# Patient Record
Sex: Female | Born: 1935 | Race: White | Hispanic: No
Health system: Southern US, Community
[De-identification: ages and names within clinical notes are randomized; demographics above are authoritative.]

## PROBLEM LIST (undated history)

## (undated) DIAGNOSIS — N8110 Cystocele, unspecified: Secondary | ICD-10-CM

## (undated) DIAGNOSIS — B356 Tinea cruris: Secondary | ICD-10-CM

## (undated) DIAGNOSIS — G4700 Insomnia, unspecified: Secondary | ICD-10-CM

## (undated) DIAGNOSIS — R32 Unspecified urinary incontinence: Secondary | ICD-10-CM

## (undated) DIAGNOSIS — R7303 Prediabetes: Secondary | ICD-10-CM

## (undated) DIAGNOSIS — I10 Essential (primary) hypertension: Secondary | ICD-10-CM

## (undated) DIAGNOSIS — E539 Vitamin B deficiency, unspecified: Secondary | ICD-10-CM

## (undated) DIAGNOSIS — F329 Major depressive disorder, single episode, unspecified: Secondary | ICD-10-CM

## (undated) DIAGNOSIS — F39 Unspecified mood [affective] disorder: Secondary | ICD-10-CM

## (undated) DIAGNOSIS — J189 Pneumonia, unspecified organism: Secondary | ICD-10-CM

## (undated) DIAGNOSIS — R634 Abnormal weight loss: Secondary | ICD-10-CM

## (undated) DIAGNOSIS — N814 Uterovaginal prolapse, unspecified: Secondary | ICD-10-CM

## (undated) DIAGNOSIS — IMO0002 Reserved for concepts with insufficient information to code with codable children: Secondary | ICD-10-CM

## (undated) DIAGNOSIS — E041 Nontoxic single thyroid nodule: Secondary | ICD-10-CM

## (undated) DIAGNOSIS — F32A Depression, unspecified: Secondary | ICD-10-CM

## (undated) DIAGNOSIS — F0390 Unspecified dementia without behavioral disturbance: Secondary | ICD-10-CM

## (undated) DIAGNOSIS — D649 Anemia, unspecified: Secondary | ICD-10-CM

## (undated) HISTORY — DX: Pneumonia, unspecified organism: J18.9

## (undated) HISTORY — DX: Nontoxic single thyroid nodule: E04.1

## (undated) HISTORY — DX: Anemia, unspecified: D64.9

## (undated) HISTORY — DX: Essential (primary) hypertension: I10

## (undated) HISTORY — DX: Cystocele, unspecified: N81.10

## (undated) HISTORY — DX: Insomnia, unspecified: G47.00

## (undated) HISTORY — DX: Tinea cruris: B35.6

## (undated) HISTORY — DX: Reserved for concepts with insufficient information to code with codable children: IMO0002

## (undated) HISTORY — DX: Vitamin B deficiency, unspecified: E53.9

## (undated) HISTORY — DX: Unspecified dementia, unspecified severity, without behavioral disturbance, psychotic disturbance, mood disturbance, and anxiety: F03.90

## (undated) HISTORY — DX: Uterovaginal prolapse, unspecified: N81.4

## (undated) HISTORY — DX: Unspecified mood (affective) disorder: F39

## (undated) HISTORY — DX: Prediabetes: R73.03

## (undated) HISTORY — DX: Abnormal weight loss: R63.4

## (undated) HISTORY — DX: Depression, unspecified: F32.A

## (undated) HISTORY — DX: Unspecified urinary incontinence: R32

## (undated) HISTORY — DX: Major depressive disorder, single episode, unspecified: F32.9

---

## 1997-07-07 ENCOUNTER — Encounter: Admission: RE | Admit: 1997-07-07 | Discharge: 1997-07-07 | Payer: Self-pay | Admitting: Sports Medicine

## 1998-04-05 ENCOUNTER — Encounter: Payer: Self-pay | Admitting: General Surgery

## 1998-04-05 ENCOUNTER — Ambulatory Visit (HOSPITAL_COMMUNITY): Admission: RE | Admit: 1998-04-05 | Discharge: 1998-04-06 | Payer: Self-pay | Admitting: General Surgery

## 2000-12-18 ENCOUNTER — Emergency Department (HOSPITAL_COMMUNITY): Admission: EM | Admit: 2000-12-18 | Discharge: 2000-12-18 | Payer: Self-pay | Admitting: *Deleted

## 2002-02-09 ENCOUNTER — Emergency Department (HOSPITAL_COMMUNITY): Admission: EM | Admit: 2002-02-09 | Discharge: 2002-02-09 | Payer: Self-pay | Admitting: Emergency Medicine

## 2002-02-09 ENCOUNTER — Encounter: Payer: Self-pay | Admitting: Emergency Medicine

## 2002-02-27 ENCOUNTER — Emergency Department (HOSPITAL_COMMUNITY): Admission: EM | Admit: 2002-02-27 | Discharge: 2002-02-27 | Payer: Self-pay

## 2002-04-23 ENCOUNTER — Ambulatory Visit (HOSPITAL_COMMUNITY): Admission: RE | Admit: 2002-04-23 | Discharge: 2002-04-23 | Payer: Self-pay | Admitting: Neurology

## 2002-05-19 ENCOUNTER — Ambulatory Visit (HOSPITAL_COMMUNITY): Admission: RE | Admit: 2002-05-19 | Discharge: 2002-05-19 | Payer: Self-pay | Admitting: Neurology

## 2002-05-19 ENCOUNTER — Encounter: Payer: Self-pay | Admitting: Neurology

## 2002-11-21 ENCOUNTER — Encounter: Payer: Self-pay | Admitting: Internal Medicine

## 2002-11-21 ENCOUNTER — Inpatient Hospital Stay (HOSPITAL_COMMUNITY): Admission: EM | Admit: 2002-11-21 | Discharge: 2002-11-25 | Payer: Self-pay | Admitting: Emergency Medicine

## 2002-11-23 ENCOUNTER — Encounter: Payer: Self-pay | Admitting: Internal Medicine

## 2003-02-09 ENCOUNTER — Emergency Department (HOSPITAL_COMMUNITY): Admission: EM | Admit: 2003-02-09 | Discharge: 2003-02-09 | Payer: Self-pay | Admitting: Emergency Medicine

## 2003-06-19 ENCOUNTER — Emergency Department (HOSPITAL_COMMUNITY): Admission: EM | Admit: 2003-06-19 | Discharge: 2003-06-19 | Payer: Self-pay | Admitting: Emergency Medicine

## 2003-06-26 ENCOUNTER — Emergency Department (HOSPITAL_COMMUNITY): Admission: EM | Admit: 2003-06-26 | Discharge: 2003-06-26 | Payer: Self-pay | Admitting: *Deleted

## 2003-07-01 ENCOUNTER — Emergency Department (HOSPITAL_COMMUNITY): Admission: EM | Admit: 2003-07-01 | Discharge: 2003-07-02 | Payer: Self-pay | Admitting: Emergency Medicine

## 2003-08-10 ENCOUNTER — Encounter: Admission: RE | Admit: 2003-08-10 | Discharge: 2003-08-10 | Payer: Self-pay | Admitting: Family Medicine

## 2003-09-15 ENCOUNTER — Emergency Department (HOSPITAL_COMMUNITY): Admission: EM | Admit: 2003-09-15 | Discharge: 2003-09-15 | Payer: Self-pay | Admitting: Emergency Medicine

## 2003-12-23 ENCOUNTER — Other Ambulatory Visit: Admission: RE | Admit: 2003-12-23 | Discharge: 2003-12-23 | Payer: Self-pay | Admitting: Obstetrics and Gynecology

## 2004-01-07 ENCOUNTER — Emergency Department (HOSPITAL_COMMUNITY): Admission: EM | Admit: 2004-01-07 | Discharge: 2004-01-07 | Payer: Self-pay | Admitting: Emergency Medicine

## 2005-07-19 ENCOUNTER — Emergency Department (HOSPITAL_COMMUNITY): Admission: EM | Admit: 2005-07-19 | Discharge: 2005-07-19 | Payer: Self-pay | Admitting: Emergency Medicine

## 2005-07-28 ENCOUNTER — Emergency Department (HOSPITAL_COMMUNITY): Admission: EM | Admit: 2005-07-28 | Discharge: 2005-07-28 | Payer: Self-pay | Admitting: Emergency Medicine

## 2005-08-01 ENCOUNTER — Emergency Department (HOSPITAL_COMMUNITY): Admission: EM | Admit: 2005-08-01 | Discharge: 2005-08-01 | Payer: Self-pay | Admitting: Emergency Medicine

## 2005-11-15 ENCOUNTER — Encounter: Admission: RE | Admit: 2005-11-15 | Discharge: 2005-11-15 | Payer: Self-pay | Admitting: Internal Medicine

## 2005-12-11 ENCOUNTER — Encounter: Admission: RE | Admit: 2005-12-11 | Discharge: 2005-12-11 | Payer: Self-pay | Admitting: Internal Medicine

## 2008-08-30 ENCOUNTER — Emergency Department (HOSPITAL_BASED_OUTPATIENT_CLINIC_OR_DEPARTMENT_OTHER): Admission: EM | Admit: 2008-08-30 | Discharge: 2008-08-30 | Payer: Self-pay | Admitting: Emergency Medicine

## 2008-12-01 ENCOUNTER — Emergency Department (HOSPITAL_BASED_OUTPATIENT_CLINIC_OR_DEPARTMENT_OTHER): Admission: EM | Admit: 2008-12-01 | Discharge: 2008-12-01 | Payer: Self-pay | Admitting: Emergency Medicine

## 2008-12-01 ENCOUNTER — Ambulatory Visit: Payer: Self-pay | Admitting: Diagnostic Radiology

## 2009-05-10 ENCOUNTER — Emergency Department (HOSPITAL_BASED_OUTPATIENT_CLINIC_OR_DEPARTMENT_OTHER): Admission: EM | Admit: 2009-05-10 | Discharge: 2009-05-10 | Payer: Self-pay | Admitting: Emergency Medicine

## 2010-02-20 ENCOUNTER — Emergency Department (HOSPITAL_BASED_OUTPATIENT_CLINIC_OR_DEPARTMENT_OTHER): Admission: EM | Admit: 2010-02-20 | Discharge: 2010-02-20 | Payer: Self-pay | Admitting: Emergency Medicine

## 2010-02-20 ENCOUNTER — Ambulatory Visit: Payer: Self-pay | Admitting: Interventional Radiology

## 2010-04-15 ENCOUNTER — Emergency Department (HOSPITAL_BASED_OUTPATIENT_CLINIC_OR_DEPARTMENT_OTHER)
Admission: EM | Admit: 2010-04-15 | Discharge: 2010-04-15 | Payer: Self-pay | Source: Home / Self Care | Admitting: Emergency Medicine

## 2010-04-24 ENCOUNTER — Encounter: Payer: Self-pay | Admitting: Family Medicine

## 2010-07-24 ENCOUNTER — Emergency Department (HOSPITAL_BASED_OUTPATIENT_CLINIC_OR_DEPARTMENT_OTHER)
Admission: EM | Admit: 2010-07-24 | Discharge: 2010-07-24 | Disposition: A | Discharge status: Home / Self Care | Payer: Medicare Other | Attending: Emergency Medicine | Admitting: Emergency Medicine

## 2010-07-24 DIAGNOSIS — I10 Essential (primary) hypertension: Secondary | ICD-10-CM | POA: Insufficient documentation

## 2010-07-24 DIAGNOSIS — F0280 Dementia in other diseases classified elsewhere without behavioral disturbance: Secondary | ICD-10-CM | POA: Insufficient documentation

## 2010-07-24 DIAGNOSIS — W07XXXA Fall from chair, initial encounter: Secondary | ICD-10-CM | POA: Insufficient documentation

## 2010-07-24 DIAGNOSIS — G309 Alzheimer's disease, unspecified: Secondary | ICD-10-CM | POA: Insufficient documentation

## 2010-07-24 DIAGNOSIS — Z0389 Encounter for observation for other suspected diseases and conditions ruled out: Secondary | ICD-10-CM | POA: Insufficient documentation

## 2010-07-24 DIAGNOSIS — Z79899 Other long term (current) drug therapy: Secondary | ICD-10-CM | POA: Insufficient documentation

## 2010-07-24 DIAGNOSIS — Y9210 Unspecified residential institution as the place of occurrence of the external cause: Secondary | ICD-10-CM | POA: Insufficient documentation

## 2010-08-01 ENCOUNTER — Emergency Department (HOSPITAL_BASED_OUTPATIENT_CLINIC_OR_DEPARTMENT_OTHER)
Admission: EM | Admit: 2010-08-01 | Discharge: 2010-08-01 | Disposition: A | Discharge status: Home / Self Care | Payer: Medicare Other | Attending: Emergency Medicine | Admitting: Emergency Medicine

## 2010-08-01 ENCOUNTER — Emergency Department (INDEPENDENT_AMBULATORY_CARE_PROVIDER_SITE_OTHER): Payer: Medicare Other

## 2010-08-01 DIAGNOSIS — S1093XA Contusion of unspecified part of neck, initial encounter: Secondary | ICD-10-CM

## 2010-08-01 DIAGNOSIS — G319 Degenerative disease of nervous system, unspecified: Secondary | ICD-10-CM | POA: Insufficient documentation

## 2010-08-01 DIAGNOSIS — F0390 Unspecified dementia without behavioral disturbance: Secondary | ICD-10-CM | POA: Insufficient documentation

## 2010-08-01 DIAGNOSIS — Y92009 Unspecified place in unspecified non-institutional (private) residence as the place of occurrence of the external cause: Secondary | ICD-10-CM | POA: Insufficient documentation

## 2010-08-01 DIAGNOSIS — W1809XA Striking against other object with subsequent fall, initial encounter: Secondary | ICD-10-CM | POA: Insufficient documentation

## 2010-08-01 DIAGNOSIS — Z79899 Other long term (current) drug therapy: Secondary | ICD-10-CM | POA: Insufficient documentation

## 2010-08-01 DIAGNOSIS — W19XXXA Unspecified fall, initial encounter: Secondary | ICD-10-CM

## 2010-08-01 DIAGNOSIS — I10 Essential (primary) hypertension: Secondary | ICD-10-CM | POA: Insufficient documentation

## 2010-08-01 DIAGNOSIS — S0990XA Unspecified injury of head, initial encounter: Secondary | ICD-10-CM | POA: Insufficient documentation

## 2010-08-19 ENCOUNTER — Emergency Department (INDEPENDENT_AMBULATORY_CARE_PROVIDER_SITE_OTHER): Payer: Medicare Other

## 2010-08-19 ENCOUNTER — Emergency Department (HOSPITAL_BASED_OUTPATIENT_CLINIC_OR_DEPARTMENT_OTHER)
Admission: EM | Admit: 2010-08-19 | Discharge: 2010-08-19 | Disposition: A | Discharge status: Home / Self Care | Payer: Medicare Other | Attending: Emergency Medicine | Admitting: Emergency Medicine

## 2010-08-19 DIAGNOSIS — S8000XA Contusion of unspecified knee, initial encounter: Secondary | ICD-10-CM

## 2010-08-19 DIAGNOSIS — S0990XA Unspecified injury of head, initial encounter: Secondary | ICD-10-CM | POA: Insufficient documentation

## 2010-08-19 DIAGNOSIS — M47812 Spondylosis without myelopathy or radiculopathy, cervical region: Secondary | ICD-10-CM

## 2010-08-19 DIAGNOSIS — M4802 Spinal stenosis, cervical region: Secondary | ICD-10-CM | POA: Insufficient documentation

## 2010-08-19 DIAGNOSIS — I10 Essential (primary) hypertension: Secondary | ICD-10-CM | POA: Insufficient documentation

## 2010-08-19 DIAGNOSIS — G319 Degenerative disease of nervous system, unspecified: Secondary | ICD-10-CM | POA: Insufficient documentation

## 2010-08-19 DIAGNOSIS — W19XXXA Unspecified fall, initial encounter: Secondary | ICD-10-CM

## 2010-08-19 DIAGNOSIS — S1093XA Contusion of unspecified part of neck, initial encounter: Secondary | ICD-10-CM

## 2010-08-19 DIAGNOSIS — M25569 Pain in unspecified knee: Secondary | ICD-10-CM | POA: Insufficient documentation

## 2010-08-19 DIAGNOSIS — F0390 Unspecified dementia without behavioral disturbance: Secondary | ICD-10-CM

## 2010-08-19 DIAGNOSIS — Z79899 Other long term (current) drug therapy: Secondary | ICD-10-CM | POA: Insufficient documentation

## 2010-08-19 DIAGNOSIS — Y9210 Unspecified residential institution as the place of occurrence of the external cause: Secondary | ICD-10-CM | POA: Insufficient documentation

## 2010-08-19 LAB — BASIC METABOLIC PANEL WITH GFR
BUN: 20 mg/dL (ref 6–23)
Chloride: 104 meq/L (ref 96–112)
GFR calc non Af Amer: 47 mL/min — ABNORMAL LOW (ref >60)
Potassium: 4.4 meq/L (ref 3.5–5.1)
Sodium: 140 meq/L (ref 135–145)

## 2010-08-19 LAB — CBC
HCT: 30.9 % — ABNORMAL LOW (ref 36.0–46.0)
Hemoglobin: 10.3 g/dL — ABNORMAL LOW (ref 12.0–15.0)
MCH: 31.6 pg (ref 26.0–34.0)
MCHC: 33.3 g/dL (ref 30.0–36.0)
MCV: 94.8 fL (ref 78.0–100.0)
Platelets: 170 K/uL (ref 150–400)
RBC: 3.26 MIL/uL — ABNORMAL LOW (ref 3.87–5.11)
RDW: 13.9 % (ref 11.5–15.5)
WBC: 8.3 K/uL (ref 4.0–10.5)

## 2010-08-19 LAB — BASIC METABOLIC PANEL
CO2: 22 mEq/L (ref 19–32)
Calcium: 9.8 mg/dL (ref 8.4–10.5)
Creatinine, Ser: 1.1 mg/dL (ref 0.4–1.2)
GFR calc Af Amer: 57 mL/min — ABNORMAL LOW (ref >60)
Glucose, Bld: 95 mg/dL (ref 70–99)

## 2010-08-19 NOTE — Discharge Summary (Signed)
NAME:  Margaret Mcgrath, Margaret Mcgrath                        ACCOUNT NO.:  [id number]   MEDICAL RECORD NO.:  [id number]                   PATIENT TYPE:  INP   LOCATION:  0371                                 FACILITY:  [facility]   PHYSICIAN:  Corinna L. Lendell Caprice, MD             DATE OF BIRTH:  02-16-1924   DATE OF ADMISSION:  11/21/2002  DATE OF DISCHARGE:  11/25/2002                                 DISCHARGE SUMMARY   DISCHARGE DIAGNOSES:  1. Anorexia, improved.  2. Abnormal CT of the pancreas.  Needs repeat CAT scan in three months.  3. Periumbilical abdominal pain.  4. Major depression, recurrent with psychotic features.  5. Tremor, psychiatric related.  6. Dementia.  7. History of alcohol abuse.   DISCHARGE MEDICATIONS:  1. She may continue her Klonopin 1 mg p.o. t.i.d.  2. She also may continue her Remeron 15 mg p.o. q.h.s.  3. Aricept 10 mg p.o. daily.  4. Paxil has been started 10 mg p.o. daily.  5. Seroquel has been started 25 mg p.o. b.i.d.   CONDITION ON DISCHARGE:  Stable.   FOLLOWUP:  Follow up with Danielle L. Mahaffey, M.D. on September 23 at 11  a.m.  Follow up with [facility] the outpatient side with Nawar  M. Alnaquib, M.D. on September 15 at 10 a.m.  Follow-up tests:  The patient  will need a follow-up CT of the pancreas in three months.   DIET:  No restrictions.   ACTIVITY:  Ad lib.   LABORATORIES:  The patient had an arsenic level which was normal.  Lead  level normal.  Mercury level normal.  Her BMET was normal.  CBC was  significant for hemoglobin of 11.4, hematocrit of 33.7, MCV 85, platelet  count 138,000, white count normal.  Homocysteine level was 42.  B12 was 241.  TSH was 0.298 which should be repeated as an outpatient as well as free T4.  UA was contaminated with many epithelial cells.  Methylmalonic acid is  pending.  Special studies in radiology:  CT of the abdomen and pelvis  without IV contrast showed mild scarring at both lung bases.  There  is a  soft tissue mass measuring 2.2 x 1.6 cm from the junction of the pancreatic  body and tail.  Ultrasound of the abdomen was done, but unable to see the  pancreas.  Upper GI and barium swallow essentially normal.  EKG shows normal  sinus rhythm.   CONSULTATIONS:  Antonietta Breach, M.D.   HISTORY AND HOSPITAL COURSE:  Ms. Margaret Mcgrath is a 77 year old demented white  female with a history of depression and nerves who presented with several  vague complaints.  She had apparently not been eating and kept complaining  of a hair on her tongue despite no evidence of such.  She also had had a  work-up of an unusual tremor which appeared completely voluntary and  involved her torso.  This  had been worked up in the past and was felt to be  supratentorial.  The patient had calorie counts and was given  supplementation.  She was started on Seroquel and Paxil and her appetite  improved as did her sensation of the hair being on her tongue.  She had no  tenderness on examination.  Has had no evidence of recent weight loss.  No  history of GI bleeding.  Sherin Quarry, MD discussed the case with three  radiologists and a surgeon and the final opinion was that this abnormal  pancreatic area on CAT scan was probably benign and as patient was not a  very good surgical candidate due to her dementia, advanced age, etc., the  decision was made to follow up with a CAT scan in three months.  The patient  had no other issues during her hospitalization and at the time of discharge  was tolerating 50-100% of her diet.  Unfortunately, on the day of discharge  I called her daughter, April Kiehn with whom patient lives and the other  daughter, Kendal Hymen answered.  Apparently, April has reportedly committed  suicide and I have discussed the case with the care manager and the  chaplain.  The patient will live with Kendal Hymen who wishes to tell her mother  about the suicide once she arrives at home.  I have encouraged Kendal Hymen  to  call 911 or bring patient to the emergency room if she has any suicidal  ideation herself.  Certainly, Ms. Feeser is encouraged to follow up with  [facility] and an appointment has been set up on September 15  at 10 a.m.   Total time on the day of discharge was 45 minutes.                                               Corinna L. Lendell Caprice, MD    CLS/MEDQ  D:  11/25/2002  T:  11/25/2002  Job:  [id number]   cc:   [facility]   Danielle L. Mahaffey, M.D.  [address]  [place]  [place] [address]  Fax: [phone]

## 2010-09-29 ENCOUNTER — Emergency Department (HOSPITAL_BASED_OUTPATIENT_CLINIC_OR_DEPARTMENT_OTHER)
Admission: EM | Admit: 2010-09-29 | Discharge: 2010-09-29 | Disposition: A | Discharge status: Home / Self Care | Payer: Medicare Other | Attending: Emergency Medicine | Admitting: Emergency Medicine

## 2010-09-29 ENCOUNTER — Emergency Department (INDEPENDENT_AMBULATORY_CARE_PROVIDER_SITE_OTHER): Payer: Medicare Other

## 2010-09-29 DIAGNOSIS — G319 Degenerative disease of nervous system, unspecified: Secondary | ICD-10-CM | POA: Insufficient documentation

## 2010-09-29 DIAGNOSIS — S0003XA Contusion of scalp, initial encounter: Secondary | ICD-10-CM | POA: Insufficient documentation

## 2010-09-29 DIAGNOSIS — W19XXXA Unspecified fall, initial encounter: Secondary | ICD-10-CM

## 2010-09-29 DIAGNOSIS — F3289 Other specified depressive episodes: Secondary | ICD-10-CM | POA: Insufficient documentation

## 2010-09-29 DIAGNOSIS — Y9210 Unspecified residential institution as the place of occurrence of the external cause: Secondary | ICD-10-CM | POA: Insufficient documentation

## 2010-09-29 DIAGNOSIS — M542 Cervicalgia: Secondary | ICD-10-CM

## 2010-09-29 DIAGNOSIS — F0390 Unspecified dementia without behavioral disturbance: Secondary | ICD-10-CM | POA: Insufficient documentation

## 2010-09-29 DIAGNOSIS — I10 Essential (primary) hypertension: Secondary | ICD-10-CM | POA: Insufficient documentation

## 2010-09-29 DIAGNOSIS — R51 Headache: Secondary | ICD-10-CM

## 2010-09-29 DIAGNOSIS — E0789 Other specified disorders of thyroid: Secondary | ICD-10-CM

## 2010-09-29 DIAGNOSIS — Z79899 Other long term (current) drug therapy: Secondary | ICD-10-CM | POA: Insufficient documentation

## 2010-09-29 DIAGNOSIS — F329 Major depressive disorder, single episode, unspecified: Secondary | ICD-10-CM | POA: Insufficient documentation

## 2010-09-29 DIAGNOSIS — S1093XA Contusion of unspecified part of neck, initial encounter: Secondary | ICD-10-CM

## 2010-12-02 ENCOUNTER — Encounter (INDEPENDENT_AMBULATORY_CARE_PROVIDER_SITE_OTHER): Payer: Self-pay | Admitting: Surgery

## 2010-12-07 ENCOUNTER — Ambulatory Visit (INDEPENDENT_AMBULATORY_CARE_PROVIDER_SITE_OTHER): Payer: Medicare Other | Admitting: Surgery

## 2010-12-07 ENCOUNTER — Encounter (INDEPENDENT_AMBULATORY_CARE_PROVIDER_SITE_OTHER): Payer: Self-pay | Admitting: Surgery

## 2010-12-07 VITALS — BP 148/82 | HR 78 | Temp 97.1°F | Wt 149.6 lb

## 2010-12-07 DIAGNOSIS — E042 Nontoxic multinodular goiter: Secondary | ICD-10-CM | POA: Insufficient documentation

## 2010-12-07 NOTE — Progress Notes (Signed)
Chief Complaint  Patient presents with  . Thyroid Nodule    Thyroid nodule - Dr. Laurine Blazer [phone]    HISTORY: Patient is an 77 year old female referred by her primary physician for evaluation of newly diagnosed thyroid nodule. Patient lives in a nursing facility due to Alzheimer's type dementia. She apparently sustained a fall and underwent diagnostic x-rays which showed an incidental finding of thyroid nodules. In July 2012 the patient underwent thyroid ultrasound by him over to radiology company from [place]. This ultrasound showed a mildly enlarged thyroid gland containing 2 nodules in the right thyroid lobe measuring 1.2 cm and 1.7 cm respectively. At the request of the patient's daughter, she is now referred to surgery for further evaluation and recommendations for management.  Ability to obtain history is limited as the patient has limited her communication skills and the health care worker who accompanies her does not know the history in detail. The patient's daughter is not present at today's consultation.  Patient has apparently not had any prior thyroid problems. She has never been on thyroid medication. She has had no prior head or neck surgery. There is no known family history of thyroid disease or other endocrinopathy.  Past Medical History  Diagnosis Date  . Dementia     with psychosis  . Depression   . Insomnia   . Anemia   . Mood disorder   . Uterine prolapse   . Vaginal prolapse   . Cystocele   . Incontinence   . HTN (hypertension)   . Dysphagia     pureed diet  . Prediabetes   . Tinea cruris   . Weight loss     03/2011  . Vitamin B deficiency   . Right thyroid nodule   . Pneumonia     10/2010     Current Outpatient Prescriptions  Medication Sig Dispense Refill  . acetaminophen (TYLENOL) 650 MG CR tablet Take 650 mg by mouth every 8 (eight) hours as needed.        Marland Kitchen albuterol (ACCUNEB) 1.25 MG/3ML nebulizer solution Take 1 ampule by nebulization every  6 (six) hours as needed. Patient takes 3 ml as needed bid       . azithromycin (ZITHROMAX) 250 MG tablet Take 250 mg by mouth daily.        . bisoprolol (ZEBETA) 5 MG tablet Take 5 mg by mouth daily.        . cholecalciferol (VITAMIN D) 1000 UNITS tablet Take 1,000 Units by mouth daily.        . cholecalciferol (VITAMIN D) 400 UNITS TABS Take by mouth.        . cyanocobalamin 1000 MCG tablet Take 100 mcg by mouth daily.        Marland Kitchen dextromethorphan-guaiFENesin (ROBITUSSIN-DM) 10-100 MG/5ML liquid Take 5 mLs by mouth every 4 (four) hours as needed.        . divalproex (DEPAKOTE SPRINKLE) 125 MG capsule Take 125 mg by mouth 2 (two) times daily.        Marland Kitchen donepezil (ARICEPT) 10 MG tablet Take 10 mg by mouth at bedtime as needed.        . ENSURE (ENSURE) Take 237 mLs by mouth.        . loperamide (IMODIUM A-D) 2 MG tablet Take 2 mg by mouth 4 (four) times daily as needed.        Marland Kitchen LORazepam (ATIVAN) 0.5 MG tablet Take 0.5 mg by mouth every 8 (eight) hours.        Marland Kitchen  megestrol (MEGACE ES) 625 MG/5ML suspension Take 625 mg by mouth daily.        . memantine (NAMENDA) 10 MG tablet Take 10 mg by mouth 2 (two) times daily.        . Multiple Vitamins-Iron (MULTIVITAMINS WITH IRON) TABS Take 1 tablet by mouth daily.        . Multiple Vitamins-Minerals (MULTIVITAMIN WITH MINERALS) tablet Take 1 tablet by mouth daily.        Marland Kitchen nystatin (MYCOSTATIN) powder Apply topically 2 (two) times daily.        Marland Kitchen olopatadine (PATANOL) 0.1 % ophthalmic solution 1 drop 2 (two) times daily.        Marland Kitchen omeprazole (PRILOSEC) 20 MG capsule Take 20 mg by mouth daily.        . permethrin (ELIMITE) 5 % cream Apply topically once.        . risperiDONE (RISPERDAL) 1 MG tablet Take 1 mg by mouth 2 (two) times daily.        Marland Kitchen senna (SENOKOT) 8.6 MG TABS Take 1 tablet by mouth.        . talc powder Apply topically as needed.        . traZODone (DESYREL) 50 MG tablet Take 50 mg by mouth at bedtime.        . vitamin C (ASCORBIC ACID) 500 MG  tablet Take 500 mg by mouth daily.           Allergies  Allergen Reactions  . Chlorpromazine   . Thorazine (Chlorpromazine Hcl)      No family history on file.   History   Social History  . Marital Status: Widowed    Spouse Name: N/A    Number of Children: N/A  . Years of Education: N/A   Social History Main Topics  . Smoking status: Never Smoker   . Smokeless tobacco: None  . Alcohol Use: No  . Drug Use: No  . Sexually Active: None   Other Topics Concern  . None   Social History Narrative  . None     REVIEW OF SYSTEMS - PERTINENT POSITIVES ONLY: The patient denies any compressive symptoms. She denies any signs or symptoms of hypo-or hyperthyroidism.   EXAM: Filed Vitals:   12/07/10 0952  BP: 148/82  Pulse: 78  Temp: 97.1 F (36.2 C)    HEENT: normocephalic; pupils equal and reactive; sclerae clear; dentition good; mucous membranes moist NECK:  Right thyroid lobe was mildly enlarged. There are palpable relatively firm nodules in the mid and inferior portion of the lobe S. spell with swallowing.; symmetric on extension; no palpable anterior or posterior cervical lymphadenopathy; no supraclavicular masses; no tenderness CHEST: clear to auscultation bilaterally without rales, rhonchi, or wheezes CARDIAC: regular rate and rhythm; Moderate grade 2 systolic murmur at upper left sternal border; peripheral pulses are full EXT:  non-tender without edema; no deformity NEURO: no gross focal deficits; moderate tremor in hands   LABORATORY RESULTS: See E-Chart for most recent results   RADIOLOGY RESULTS: See E-Chart or I-Site for most recent results   IMPRESSION: #1 small thyroid goiter without compressive symptoms #2 multiple right thyroid nodules, dominant nodule 1.7 cm #3 Alzheimer's type dementia   PLAN: I discussed with the patient and her health care worker the above findings. If further evaluation is desired by the patient's family, then I would  recommend an ultrasound-guided fine-needle aspiration biopsy of the dominant nodule. We will also check thyroid function tests to include TSH level,  total T3 level, and total T4 level.  Once these results are available I will communicate them with the patient's family and with her primary care physician.   Velora Heckler, MD, FACS General & Endocrine Surgery [facility]      Visit Diagnoses: 1. Multinodular goiter (nontoxic)     Primary Care Physician: Thane Edu, MD

## 2010-12-28 ENCOUNTER — Emergency Department (INDEPENDENT_AMBULATORY_CARE_PROVIDER_SITE_OTHER): Payer: Medicare Other

## 2010-12-28 ENCOUNTER — Other Ambulatory Visit: Payer: Medicare Other

## 2010-12-28 ENCOUNTER — Encounter (HOSPITAL_BASED_OUTPATIENT_CLINIC_OR_DEPARTMENT_OTHER): Payer: Self-pay | Admitting: Emergency Medicine

## 2010-12-28 ENCOUNTER — Emergency Department (HOSPITAL_BASED_OUTPATIENT_CLINIC_OR_DEPARTMENT_OTHER)
Admission: EM | Admit: 2010-12-28 | Discharge: 2010-12-28 | Disposition: A | Discharge status: Skilled Nursing Facility | Payer: Medicare Other | Attending: Emergency Medicine | Admitting: Emergency Medicine

## 2010-12-28 ENCOUNTER — Ambulatory Visit
Admission: RE | Admit: 2010-12-28 | Discharge: 2010-12-28 | Disposition: A | Discharge status: Home / Self Care | Payer: Medicare Other | Source: Ambulatory Visit | Attending: Surgery | Admitting: Surgery

## 2010-12-28 DIAGNOSIS — R51 Headache: Secondary | ICD-10-CM | POA: Insufficient documentation

## 2010-12-28 DIAGNOSIS — W1809XA Striking against other object with subsequent fall, initial encounter: Secondary | ICD-10-CM | POA: Insufficient documentation

## 2010-12-28 DIAGNOSIS — W19XXXA Unspecified fall, initial encounter: Secondary | ICD-10-CM

## 2010-12-28 DIAGNOSIS — N200 Calculus of kidney: Secondary | ICD-10-CM

## 2010-12-28 DIAGNOSIS — K5730 Diverticulosis of large intestine without perforation or abscess without bleeding: Secondary | ICD-10-CM

## 2010-12-28 DIAGNOSIS — F0390 Unspecified dementia without behavioral disturbance: Secondary | ICD-10-CM | POA: Insufficient documentation

## 2010-12-28 DIAGNOSIS — Y9210 Unspecified residential institution as the place of occurrence of the external cause: Secondary | ICD-10-CM | POA: Insufficient documentation

## 2010-12-28 DIAGNOSIS — Z79899 Other long term (current) drug therapy: Secondary | ICD-10-CM | POA: Insufficient documentation

## 2010-12-28 DIAGNOSIS — N201 Calculus of ureter: Secondary | ICD-10-CM

## 2010-12-28 DIAGNOSIS — N1330 Unspecified hydronephrosis: Secondary | ICD-10-CM

## 2010-12-28 DIAGNOSIS — I10 Essential (primary) hypertension: Secondary | ICD-10-CM | POA: Insufficient documentation

## 2010-12-28 NOTE — ED Provider Notes (Signed)
History    the patient presents from her nursing home after a fall. She is currently without complaints. She has dementia, but states that she has been otherwise well. Per reports she was being assisted by staff and fell backwards striking her occiput against a handrail. No loss of consciousness, no emesis, no visual changes, no sustained pain.  CSN: [id number] Arrival date & time: 12/28/2010  1:01 AM  Chief Complaint  Patient presents with  . Fall    HPI  (Consider location/radiation/quality/duration/timing/severity/associated sxs/prior treatment)  HPI  Past Medical History  Diagnosis Date  . Dementia     with psychosis  . Depression   . Insomnia   . Anemia   . Mood disorder   . Uterine prolapse   . Vaginal prolapse   . Cystocele   . Incontinence   . HTN (hypertension)   . Dysphagia     pureed diet  . Prediabetes   . Tinea cruris   . Weight loss     03/2011  . Vitamin B deficiency   . Right thyroid nodule   . Pneumonia     10/2010    History reviewed. No pertinent past surgical history.  No family history on file.  History  Substance Use Topics  . Smoking status: Never Smoker   . Smokeless tobacco: Not on file  . Alcohol Use: No    OB History    Grav Para Term Preterm Abortions TAB SAB Ect Mult Living                  Review of Systems  Review of Systems  Unable to perform ROS  patient's dementia makes ROS unreliable. Level V caveat  Allergies  Chlorpromazine and Thorazine  Home Medications   Current Outpatient Rx  Name Route Sig Dispense Refill  . ACETAMINOPHEN 650 MG PO TBCR Oral Take 650 mg by mouth every 8 (eight) hours as needed.      . ALBUTEROL SULFATE 1.25 MG/3ML IN NEBU Nebulization Take 1 ampule by nebulization every 6 (six) hours as needed. Patient takes 3 ml as needed bid     . AZITHROMYCIN 250 MG PO TABS Oral Take 250 mg by mouth daily.      Marland Kitchen BISOPROLOL FUMARATE 5 MG PO TABS Oral Take 5 mg by mouth daily.      Marland Kitchen VITAMIN D 1000  UNITS PO TABS Oral Take 1,000 Units by mouth daily.      . CHOLECALCIFEROL 400 UNITS PO TABS Oral Take by mouth.      . CYANOCOBALAMIN 1000 MCG PO TABS Oral Take 100 mcg by mouth daily.      Marland Kitchen DEXTROMETHORPHAN-GUAIFENESIN 10-100 MG/5ML PO LIQD Oral Take 5 mLs by mouth every 4 (four) hours as needed.      Marland Kitchen DIVALPROEX SODIUM 125 MG PO CPSP Oral Take 125 mg by mouth 2 (two) times daily.      . DONEPEZIL HCL 10 MG PO TABS Oral Take 10 mg by mouth at bedtime as needed.      . ENSURE PO LIQD Oral Take 237 mLs by mouth.      Marland Kitchen LOPERAMIDE HCL 2 MG PO TABS Oral Take 2 mg by mouth 4 (four) times daily as needed.      Marland Kitchen LORAZEPAM 0.5 MG PO TABS Oral Take 0.5 mg by mouth every 8 (eight) hours.      . MEGESTROL ACETATE 625 MG/5ML PO SUSP Oral Take 625 mg by mouth daily.      Marland Kitchen  MEMANTINE HCL 10 MG PO TABS Oral Take 10 mg by mouth 2 (two) times daily.      . TAB-A-VITE/IRON PO TABS Oral Take 1 tablet by mouth daily.      . MULTI-VITAMIN/MINERALS PO TABS Oral Take 1 tablet by mouth daily.      . NYSTATIN 100000 UNIT/GM EX POWD Topical Apply topically 2 (two) times daily.      . OLOPATADINE HCL 0.1 % OP SOLN  1 drop 2 (two) times daily.      Marland Kitchen OMEPRAZOLE 20 MG PO CPDR Oral Take 20 mg by mouth daily.      Marland Kitchen PERMETHRIN 5 % EX CREA Topical Apply topically once.      Marland Kitchen RISPERIDONE 1 MG PO TABS Oral Take 1 mg by mouth 2 (two) times daily.      . SENNA 8.6 MG PO TABS Oral Take 1 tablet by mouth.      Marland Kitchen TALC EX POWD Topical Apply topically as needed.      . TRAZODONE HCL 50 MG PO TABS Oral Take 50 mg by mouth at bedtime.      Marland Kitchen VITAMIN C 500 MG PO TABS Oral Take 500 mg by mouth daily.        Physical Exam    BP 152/69  Pulse 62  Temp(Src) 97.9 F (36.6 C) (Oral)  Resp 19  SpO2 100%  Physical Exam  Constitutional: No distress.  HENT:  Head: Normocephalic and atraumatic.  Eyes: EOM are normal. Pupils are equal, round, and reactive to light.  Neck: Neck supple. No spinous process tenderness present. No  rigidity.  Cardiovascular: Normal rate.   Murmur heard. Pulmonary/Chest: Effort normal. No respiratory distress.  Abdominal: Soft. She exhibits no distension.  Neurological: She is alert. No cranial nerve deficit. Coordination normal.  Skin: Skin is warm and dry. No rash noted. She is not diaphoretic. There is pallor.    ED Course  Procedures (including critical care time)  Labs Reviewed - No data to display No results found.   No diagnosis found.   MDM Elderly female with dementia presents from a nursing home following a fall. The mechanism of the fall (minimal) the absence of complaints by the patient, and the essentially unremarkable physical exam are all reassuring the patient will evaluate the CAT scan, and if this is negative she will likely be returned to the nursing home.      CT neg.  Patient continues to deny complaints, and (again) notes that she wants to be d/c back to NH.   Gerhard Munch, MD 12/28/10 [phone]

## 2010-12-28 NOTE — ED Notes (Signed)
Pt fell while being assisted by staff back to room. Pt fell backward and striking back of head on hand rail. Hematoma noted to head. Staff denied any LOC.

## 2010-12-28 NOTE — ED Notes (Signed)
Warm blankets applied, pt fed.

## 2011-02-28 ENCOUNTER — Emergency Department (INDEPENDENT_AMBULATORY_CARE_PROVIDER_SITE_OTHER): Payer: Medicare Other

## 2011-02-28 ENCOUNTER — Encounter (HOSPITAL_BASED_OUTPATIENT_CLINIC_OR_DEPARTMENT_OTHER): Payer: Self-pay | Admitting: *Deleted

## 2011-02-28 ENCOUNTER — Emergency Department (HOSPITAL_BASED_OUTPATIENT_CLINIC_OR_DEPARTMENT_OTHER)
Admission: EM | Admit: 2011-02-28 | Discharge: 2011-02-28 | Disposition: A | Discharge status: Home / Self Care | Payer: Medicare Other | Attending: Emergency Medicine | Admitting: Emergency Medicine

## 2011-02-28 DIAGNOSIS — R51 Headache: Secondary | ICD-10-CM

## 2011-02-28 DIAGNOSIS — S0083XA Contusion of other part of head, initial encounter: Secondary | ICD-10-CM

## 2011-02-28 DIAGNOSIS — R040 Epistaxis: Secondary | ICD-10-CM | POA: Insufficient documentation

## 2011-02-28 DIAGNOSIS — S42009A Fracture of unspecified part of unspecified clavicle, initial encounter for closed fracture: Secondary | ICD-10-CM

## 2011-02-28 DIAGNOSIS — E049 Nontoxic goiter, unspecified: Secondary | ICD-10-CM | POA: Insufficient documentation

## 2011-02-28 DIAGNOSIS — F0390 Unspecified dementia without behavioral disturbance: Secondary | ICD-10-CM | POA: Insufficient documentation

## 2011-02-28 DIAGNOSIS — Y9210 Unspecified residential institution as the place of occurrence of the external cause: Secondary | ICD-10-CM | POA: Insufficient documentation

## 2011-02-28 DIAGNOSIS — M25519 Pain in unspecified shoulder: Secondary | ICD-10-CM

## 2011-02-28 DIAGNOSIS — I10 Essential (primary) hypertension: Secondary | ICD-10-CM | POA: Insufficient documentation

## 2011-02-28 DIAGNOSIS — Z79899 Other long term (current) drug therapy: Secondary | ICD-10-CM | POA: Insufficient documentation

## 2011-02-28 DIAGNOSIS — M542 Cervicalgia: Secondary | ICD-10-CM | POA: Insufficient documentation

## 2011-02-28 DIAGNOSIS — S42002A Fracture of unspecified part of left clavicle, initial encounter for closed fracture: Secondary | ICD-10-CM

## 2011-02-28 DIAGNOSIS — W19XXXA Unspecified fall, initial encounter: Secondary | ICD-10-CM

## 2011-02-28 DIAGNOSIS — F329 Major depressive disorder, single episode, unspecified: Secondary | ICD-10-CM | POA: Insufficient documentation

## 2011-02-28 DIAGNOSIS — F3289 Other specified depressive episodes: Secondary | ICD-10-CM | POA: Insufficient documentation

## 2011-02-28 LAB — URINALYSIS, ROUTINE W REFLEX MICROSCOPIC
Bilirubin Urine: NEGATIVE
Glucose, UA: NEGATIVE mg/dL
Ketones, ur: NEGATIVE mg/dL
Nitrite: NEGATIVE
Specific Gravity, Urine: 1.008 (ref 1.005–1.030)
pH: 8 (ref 5.0–8.0)

## 2011-02-28 MED ORDER — IBUPROFEN 800 MG PO TABS
800.0000 mg | ORAL_TABLET | Freq: Once | ORAL | Status: AC
  Administered 2011-02-28: 800 mg via ORAL
  Filled 2011-02-28: qty 1

## 2011-02-28 MED ORDER — NAPROXEN 500 MG PO TABS: 500.0000 mg | ORAL_TABLET | Freq: Two times a day (BID) | ORAL | Status: AC

## 2011-02-28 NOTE — ED Notes (Signed)
[facility] called for transport.  

## 2011-02-28 NOTE — ED Notes (Signed)
Report called to [employer] Staff member at [employer].

## 2011-02-28 NOTE — ED Provider Notes (Addendum)
History     CSN: [id number] Arrival date & time: 02/28/2011  4:45 AM   First MD Initiated Contact with Patient 02/28/11 0449      Chief Complaint  Patient presents with  . Fall    (Consider location/radiation/quality/duration/timing/severity/associated sxs/prior treatment) HPI Comments: According to paramedics, the patient was found on the floor after an unwitnessed fall. They transported the patient without spinal precautions and without cervical collar. She was noted to have some blood from her nose and admits to having a trip and fall but cannot give any further description of her fall. According to the medical record the patient has been seen multiple times in the past for falls.  Patient is a 77 y.o. female presenting with fall. The history is provided by the EMS personnel, the nursing home and the patient. The history is limited by the condition of the patient (Dementia).  Fall The accident occurred less than 1 hour ago. The fall occurred while walking. Distance fallen: standing. She landed on carpet. The volume of blood lost was minimal (from nose). The point of impact was the head (temporal rigth). The pain is present in the head. She was not ambulatory at the scene. Associated symptoms include headaches.    Past Medical History  Diagnosis Date  . Dementia     with psychosis  . Depression   . Insomnia   . Anemia   . Mood disorder   . Uterine prolapse   . Vaginal prolapse   . Cystocele   . Incontinence   . HTN (hypertension)   . Dysphagia     pureed diet  . Prediabetes   . Tinea cruris   . Weight loss     03/2011  . Vitamin B deficiency   . Right thyroid nodule   . Pneumonia     10/2010    History reviewed. No pertinent past surgical history.  No family history on file.  History  Substance Use Topics  . Smoking status: Never Smoker   . Smokeless tobacco: Not on file  . Alcohol Use: No    OB History    Grav Para Term Preterm Abortions TAB SAB Ect Mult  Living                  Review of Systems  Unable to perform ROS: Dementia  Neurological: Positive for headaches.    Allergies  Chlorpromazine and Thorazine  Home Medications   Current Outpatient Rx  Name Route Sig Dispense Refill  . VITAMIN D 1000 UNITS PO TABS Oral Take 1,000 Units by mouth daily.      . CHOLECALCIFEROL 400 UNITS PO TABS Oral Take by mouth.      . DONEPEZIL HCL 10 MG PO TABS Oral Take 10 mg by mouth at bedtime as needed.      . ENSURE PO LIQD Oral Take 237 mLs by mouth.      . MEGESTROL ACETATE 625 MG/5ML PO SUSP Oral Take 625 mg by mouth daily.      Marland Kitchen MEMANTINE HCL 10 MG PO TABS Oral Take 10 mg by mouth 2 (two) times daily.      . MULTI-VITAMIN/MINERALS PO TABS Oral Take 1 tablet by mouth daily.      . NYSTATIN 100000 UNIT/GM EX POWD Topical Apply topically 2 (two) times daily.      Marland Kitchen OMEPRAZOLE 20 MG PO CPDR Oral Take 20 mg by mouth daily.      . TRAZODONE HCL 100 MG PO TABS  Oral Take 100 mg by mouth at bedtime.      Marland Kitchen VITAMIN C 500 MG PO TABS Oral Take 500 mg by mouth daily.      . ACETAMINOPHEN ER 650 MG PO TBCR Oral Take 650 mg by mouth every 8 (eight) hours as needed.      . ALBUTEROL SULFATE 1.25 MG/3ML IN NEBU Nebulization Take 1 ampule by nebulization every 6 (six) hours as needed. Patient takes 3 ml as needed bid     . AZITHROMYCIN 250 MG PO TABS Oral Take 250 mg by mouth daily.      Marland Kitchen BISOPROLOL FUMARATE 5 MG PO TABS Oral Take 5 mg by mouth daily.      . CYANOCOBALAMIN 1000 MCG PO TABS Oral Take 100 mcg by mouth daily.      Marland Kitchen DEXTROMETHORPHAN-GUAIFENESIN 10-100 MG/5ML PO LIQD Oral Take 5 mLs by mouth every 4 (four) hours as needed.      Marland Kitchen DIVALPROEX SODIUM 125 MG PO CPSP Oral Take 125 mg by mouth 2 (two) times daily.      Marland Kitchen LOPERAMIDE HCL 2 MG PO TABS Oral Take 2 mg by mouth 4 (four) times daily as needed.      Marland Kitchen LORAZEPAM 0.5 MG PO TABS Oral Take 0.5 mg by mouth every 8 (eight) hours.      . TAB-A-VITE/IRON PO TABS Oral Take 1 tablet by mouth daily.       Marland Kitchen NAPROXEN 500 MG PO TABS Oral Take 1 tablet (500 mg total) by mouth 2 (two) times daily with a meal. 30 tablet 0  . OLOPATADINE HCL 0.1 % OP SOLN  1 drop 2 (two) times daily.      Marland Kitchen PERMETHRIN 5 % EX CREA Topical Apply topically once.      Marland Kitchen RISPERIDONE 1 MG PO TABS Oral Take 1 mg by mouth 2 (two) times daily.      . SENNA 8.6 MG PO TABS Oral Take 1 tablet by mouth.      Marland Kitchen TALC EX POWD Topical Apply topically as needed.      . TRAZODONE HCL 50 MG PO TABS Oral Take 50 mg by mouth at bedtime.        BP 181/83  Pulse 85  Temp(Src) 97.6 F (36.4 C) (Oral)  Resp 18  SpO2 100%  Physical Exam  Constitutional: She appears well-developed and well-nourished. No distress.  HENT:  Head: Normocephalic.  Mouth/Throat: Oropharynx is clear and moist. No oropharyngeal exudate.       Mild blood dried crusting around the nares, oropharynx clear with moist mucous membranes.  No tenderness in the periorbital or facial region, no malocclusion, hematoma to the right temporal parietal area  Eyes: Conjunctivae and EOM are normal. Pupils are equal, round, and reactive to light. Right eye exhibits no discharge. Left eye exhibits no discharge. No scleral icterus.  Neck: No tracheal deviation present.  Cardiovascular: Normal rate, regular rhythm and intact distal pulses.  Exam reveals no gallop and no friction rub.   No murmur heard. Pulmonary/Chest: Effort normal and breath sounds normal. No respiratory distress. She has no wheezes. She has no rales.  Abdominal: Soft. Bowel sounds are normal. She exhibits no distension. There is no tenderness.  Musculoskeletal: Normal range of motion. She exhibits tenderness ( Tender to palpation over the left clavicle and shoulder, good range of motion of this joint though with pain. ). She exhibits no edema.  Lymphadenopathy:    She has no cervical adenopathy.  Neurological: She is alert.       Mild tremor, speech clear, follows directions, memory is impaired strength  in all 4 extremities is 5 out of 5  NV intact distal to the L shoulder sensation adn pulses.  Skin: Skin is warm and dry. No rash noted. She is not diaphoretic.       No hematomas or ecchymosis or abrasions to the extremities or the trunk    ED Course  Procedures (including critical care time)   Labs Reviewed  URINALYSIS, ROUTINE W REFLEX MICROSCOPIC    CT HEAD  IMPRESSION:  1. No evidence of traumatic intracranial injury or fracture. 2. Soft tissue swelling noted bilaterally at the vertex. 3. Mild to moderate cortical volume loss and diffuse small vessel ischemic microangiopathy.  CT CERVICAL SPINE  IMPRESSION:  1. No evidence of fracture or subluxation along the cervical spine. 2. Mild degenerative change along the lower cervical spine. 3. Scarring of mild calcification at the right lung apex. 4. Persistent complex heterogeneity within the enlarged right hepatic lobe, with new focal calcification; however, no discrete mass was identified on recent thyroid ultrasound. Would follow up with laboratory values as deemed clinically appropriate. 5. Calcification noted at the carotid bifurcations bilaterally. Carotid ultrasound would be helpful for further evaluation, when and as deemed clinically appropriate.  Original Report Authenticated By: Tonia Ghent, M.D.  LEFT SHOULDER - 2+ VIEW  IMPRESSION: Displaced fracture involving the distal aspect of the left clavicle, with superior displacement of the distal fragment and mild shortening at the fracture site.  Original Report Authenticated By: Tonia Ghent, M.D.   1. Fracture of clavicle, left, closed       MDM  Injuries including the head and the left shoulder. There is no tenderness over the nasal bones despite having mild bleeding in the nose. There is no active bleeding and no septal hematoma visualized, x-ray of shoulder and CT scan head and C-spine  Findings communicated the patient, a sling placed, pain  medication given. Definitive fracture care given.  Definitive Fracture Care:  Definitive fracture care performred for the clavicle.  This included analgesia in the ED,and a sling, which have been provided.  I have counseled the pt on possible complications of the fractures and signs and symptoms which would mandate return for further care.        Vida Roller, MD 02/28/11 7829  Vida Roller, MD 02/28/11 [phone]

## 2011-02-28 NOTE — ED Notes (Signed)
Pt states she does not remember the cause of fall. Pt has dementia and is disoriented per norm. Pt presents with a bloody nose and c/o headache. 2 hematomas noted to posterior head

## 2011-02-28 NOTE — ED Notes (Addendum)
Unwitnessed fall that occurred at [facility] tonight. Pt was lying on her back on carpeted floor with a bloody nose. Pt c/o headache. Denies any other palpable pain.

## 2012-07-01 ENCOUNTER — Other Ambulatory Visit (HOSPITAL_COMMUNITY): Payer: Self-pay | Admitting: Family Medicine

## 2012-07-01 DIAGNOSIS — R1310 Dysphagia, unspecified: Secondary | ICD-10-CM

## 2012-07-05 ENCOUNTER — Emergency Department (HOSPITAL_BASED_OUTPATIENT_CLINIC_OR_DEPARTMENT_OTHER)
Admission: EM | Admit: 2012-07-05 | Discharge: 2012-07-05 | Disposition: A | Discharge status: Skilled Nursing Facility | Payer: Medicare Other | Attending: Emergency Medicine | Admitting: Emergency Medicine

## 2012-07-05 ENCOUNTER — Emergency Department (HOSPITAL_BASED_OUTPATIENT_CLINIC_OR_DEPARTMENT_OTHER): Payer: Medicare Other

## 2012-07-05 ENCOUNTER — Encounter (HOSPITAL_BASED_OUTPATIENT_CLINIC_OR_DEPARTMENT_OTHER): Payer: Self-pay | Admitting: *Deleted

## 2012-07-05 DIAGNOSIS — I10 Essential (primary) hypertension: Secondary | ICD-10-CM | POA: Insufficient documentation

## 2012-07-05 DIAGNOSIS — S0990XA Unspecified injury of head, initial encounter: Secondary | ICD-10-CM | POA: Insufficient documentation

## 2012-07-05 DIAGNOSIS — Z8659 Personal history of other mental and behavioral disorders: Secondary | ICD-10-CM | POA: Insufficient documentation

## 2012-07-05 DIAGNOSIS — Y929 Unspecified place or not applicable: Secondary | ICD-10-CM | POA: Insufficient documentation

## 2012-07-05 DIAGNOSIS — Y9389 Activity, other specified: Secondary | ICD-10-CM | POA: Insufficient documentation

## 2012-07-05 DIAGNOSIS — W1809XA Striking against other object with subsequent fall, initial encounter: Secondary | ICD-10-CM | POA: Insufficient documentation

## 2012-07-05 DIAGNOSIS — Z79899 Other long term (current) drug therapy: Secondary | ICD-10-CM | POA: Insufficient documentation

## 2012-07-05 DIAGNOSIS — F0390 Unspecified dementia without behavioral disturbance: Secondary | ICD-10-CM | POA: Insufficient documentation

## 2012-07-05 NOTE — ED Provider Notes (Signed)
History     CSN: [id number]  Arrival date & time 07/05/12  1543   First MD Initiated Contact with Patient 07/05/12 1543      Chief Complaint  Patient presents with  . Fall    (Consider location/radiation/quality/duration/timing/severity/associated sxs/prior treatment) Patient is a 79 y.o. female presenting with fall.  Fall   Level 5 caveat due to dementia Pt with history of dementia brought to the ED from local [facility] after a reported fall. The fall was unwitnessed, but patient apparently got up and told staff she fell. She states she fell because she was 'trying to get some food'.  Past Medical History  Diagnosis Date  . Dementia   . HTN (hypertension)   . Depression     History reviewed. No pertinent past surgical history.  History reviewed. No pertinent family history.  History  Substance Use Topics  . Smoking status: Unknown If Ever Smoked  . Smokeless tobacco: Not on file  . Alcohol Use: No    OB History   Grav Para Term Preterm Abortions TAB SAB Ect Mult Living                  Review of Systems Unable to assess due to mental status.   Allergies  Chlorpromazine  Home Medications   Current Outpatient Rx  Name  Route  Sig  Dispense  Refill  . calcium-vitamin D (OSCAL WITH D) 500-200 MG-UNIT per tablet   Oral   Take 1 tablet by mouth daily.         . cetirizine (ZYRTEC) 10 MG tablet   Oral   Take 10 mg by mouth daily.         Marland Kitchen donepezil (ARICEPT) 10 MG tablet   Oral   Take 10 mg by mouth at bedtime as needed.         . memantine (NAMENDA) 10 MG tablet   Oral   Take 10 mg by mouth 2 (two) times daily.         . Multiple Vitamin (MULTIVITAMIN) capsule   Oral   Take 1 capsule by mouth daily.         Marland Kitchen omeprazole (PRILOSEC) 20 MG capsule   Oral   Take 20 mg by mouth daily.         . vitamin C (ASCORBIC ACID) 500 MG tablet   Oral   Take 500 mg by mouth daily.           BP 99/66  Temp(Src) 97.4 F (36.3 C) (Oral)  Resp  22  SpO2 100%  Physical Exam  Nursing note and vitals reviewed. Constitutional: She appears well-developed and well-nourished.  HENT:  Head: Normocephalic.  Hematoma to L parietal scalp  Eyes: EOM are normal. Pupils are equal, round, and reactive to light.  Neck: Normal range of motion. Neck supple.  Cardiovascular: Normal rate, normal heart sounds and intact distal pulses.   Pulmonary/Chest: Effort normal and breath sounds normal.  Abdominal: Bowel sounds are normal. She exhibits no distension. There is no tenderness.  Musculoskeletal: Normal range of motion. She exhibits no edema and no tenderness.  Neurological: She is alert. She has normal strength. No cranial nerve deficit or sensory deficit.  Skin: Skin is warm and dry. No rash noted.  Psychiatric: She has a normal mood and affect.    ED Course  Procedures (including critical care time)  Labs Reviewed - No data to display No results found.   1. Head injury, initial encounter  MDM  Scalp hematoma, no other apparent injuries.   4:32 PM CT neg. Will return to SNF.         Charles B. Bernette Mayers, MD 07/05/12 [phone]

## 2012-07-05 NOTE — ED Notes (Signed)
Patient transported to CT 

## 2012-07-05 NOTE — ED Notes (Signed)
Pt brought in by ems from [facility] for fall from standing hitting head.

## 2012-07-05 NOTE — ED Notes (Signed)
Report received from [facility]. Assumed care of pt at this time.  Awaiting [facility] for pt transport back to [facility]. Pt is alert, talking but confused per her norm.

## 2012-07-08 ENCOUNTER — Ambulatory Visit (HOSPITAL_COMMUNITY)
Admission: RE | Admit: 2012-07-08 | Discharge: 2012-07-08 | Disposition: A | Discharge status: Home / Self Care | Payer: Medicare Other | Source: Ambulatory Visit | Attending: Family Medicine | Admitting: Family Medicine

## 2012-07-08 ENCOUNTER — Encounter (HOSPITAL_BASED_OUTPATIENT_CLINIC_OR_DEPARTMENT_OTHER): Payer: Self-pay | Admitting: *Deleted

## 2012-07-08 DIAGNOSIS — R1310 Dysphagia, unspecified: Secondary | ICD-10-CM

## 2012-07-08 NOTE — Procedures (Signed)
Objective Swallowing Evaluation: Modified Barium Swallowing Study  Patient Details  Name: Margaret Mcgrath MRN: [id number] Date of Birth: 1924/03/06  Today's Date: 07/08/2012 Time: 4403-4742 SLP Time Calculation (min): 23 min  Past Medical History:  Past Medical History  Diagnosis Date  . Dementia     with psychosis  . Insomnia   . Anemia   . Mood disorder   . Uterine prolapse   . Vaginal prolapse   . Cystocele   . Incontinence   . Dysphagia     pureed diet  . Prediabetes   . Tinea cruris   . Weight loss     03/2011  . Vitamin B deficiency   . Right thyroid nodule   . Pneumonia     10/2010  . Dementia   . HTN (hypertension)   . Depression    Past Surgical History: No past surgical history on file. HPI:  79 yo female resident of [facility] referred for MBS - pt current diet is puree/nectar liquid but med tech Britta Mccreedy reports pt sneaks food and thin drinks from other residents.  Pt PMH + for dementia, depression, HTN, anemia, weight loss, dysphagia, pna 10/2010.  Med tech reports pt will cough at times with meals and her intake has been declining.       Assessment / Plan / Recommendation Clinical Impression  Dysphagia Diagnosis: Moderate oral phase dysphagia;Mild pharyngeal phase dysphagia  Clinical impression: Moderate oral and mild pharyngeal dysphagia with suspected possible mild esophageal deficits as well *radiologist not present for evalaution.  Oral deficits characterized by decreased coordination/strength resulting in decreased bolus cohesion, delayed transit with premature spillage of barium into pharynx.   Pt without aspiration or deep laryngeal penetration of any consistency tested.  Cough x1 observed during testing Pharyngeal swallow was strong without residuals albeit mildly delayed at times.  Oral transit of cracker was difficult for pt with A-P lingual rocking, "mastication" on palate - but she was protective of her airway.   Pt did not follow directions fully  during testing and became agitated on a few occasions.  Pt was able to self feed which enhances her airway protection, but she is impulsive.       Rec consider advancing diet to allow thin liquids and continue SLP treatment to aid in transitioning to soft solids as indicated (pt edentulous).      Treatment Recommendation       Diet Recommendation Thin liquid;Dysphagia 3 (Mechanical Soft);Dysphagia 1 (Puree) (trials of solids with SLP for transitioning)   Liquid Administration via: Cup;Straw Medication Administration: Crushed with puree Supervision: Patient able to self feed Compensations: Slow rate;Small sips/bites Postural Changes and/or Swallow Maneuvers: Seated upright 90 degrees;Upright 30-60 min after meal       Follow Up Recommendations  Home health SLP           SLP Swallow Goals     General Date of Onset: 07/08/12 HPI: 79 yo female resident of [facility] referred for MBS - pt current diet is puree/nectar liquid but med tech Britta Mccreedy reports pt sneaks food and thin drinks from other residents.  Pt PMH + for dementia, depression, HTN, anemia, weight loss, dysphagia, pna 10/2010.  Med tech reports pt will cough at times with meals and her intake has been declining.   Type of Study: Modified Barium Swallowing Study Reason for Referral: Objectively evaluate swallowing function Diet Prior to this Study: Thin liquids;Dysphagia 1 (puree) Temperature Spikes Noted: No Respiratory Status: Room air History of Recent Intubation:  No Behavior/Cognition: Alert;Cooperative;Confused;Agitated Oral Cavity - Dentition: Edentulous Oral Motor / Sensory Function: Within functional limits (no focal deficits) Self-Feeding Abilities: Able to feed self Patient Positioning: Upright in chair Baseline Vocal Quality: Clear Volitional Cough: Cognitively unable to elicit Volitional Swallow: Able to elicit Anatomy: Within functional limits Pharyngeal Secretions: Not observed secondary MBS    Reason  for Referral Objectively evaluate swallowing function   Oral Phase Oral Preparation/Oral Phase Oral Phase: Impaired Oral - Nectar Oral - Nectar Cup: Delayed oral transit;Weak lingual manipulation Oral - Nectar Straw: Delayed oral transit;Weak lingual manipulation Oral - Thin Oral - Thin Cup: Delayed oral transit;Weak lingual manipulation Oral - Thin Straw: Delayed oral transit;Weak lingual manipulation Oral - Solids Oral - Puree: Delayed oral transit;Weak lingual manipulation Oral - Regular: Delayed oral transit;Impaired mastication;Weak lingual manipulation;Reduced posterior propulsion;Lingual pumping;Piecemeal swallowing Oral - Pill: Weak lingual manipulation;Lingual pumping;Reduced posterior propulsion;Pocketing in anterior sulcus (pt spit out barium tablet, unable to transit and swallow)   Pharyngeal Phase Pharyngeal Phase Pharyngeal Phase: Impaired Pharyngeal - Nectar Pharyngeal - Nectar Cup: Premature spillage to valleculae Pharyngeal - Nectar Straw: Premature spillage to pyriform sinuses;Premature spillage to valleculae Pharyngeal - Thin Pharyngeal - Thin Cup: Premature spillage to valleculae;Premature spillage to pyriform sinuses;Delayed swallow initiation Pharyngeal - Thin Straw: Premature spillage to valleculae;Premature spillage to pyriform sinuses;Penetration/Aspiration during swallow Penetration/Aspiration details (thin straw): Material enters airway, remains ABOVE vocal cords then ejected out (cough noted with trace penetration) Pharyngeal - Solids Pharyngeal - Puree: Premature spillage to pyriform sinuses;Premature spillage to valleculae Pharyngeal - Regular: Premature spillage to valleculae Pharyngeal - Pill:  (pt did not orally transit, spit into her hand)  Cervical Esophageal Phase    GO    Cervical Esophageal Phase Cervical Esophageal Phase: Impaired Cervical Esophageal Phase - Thin Thin Cup: Prominent cricopharyngeal segment Thin Straw: Prominent  cricopharyngeal segment Cervical Esophageal Phase - Comment Cervical Esophageal Comment: Appearance of delayed clearance of thin barium at distal esophagus with ? mild widened esophagus, ? consistent with esophageal dysmotlity, radiologist not present to confirm,  pt on a PPI per referrral information.     Functional Assessment Tool Used: MBS, clinical judgement Functional Limitations: Swallowing Swallow Current Status (Z6109): At least 1 percent but less than 20 percent impaired, limited or restricted Swallow Goal Status [phone]): At least 1 percent but less than 20 percent impaired, limited or restricted Swallow Discharge Status [phone]): At least 1 percent but less than 20 percent impaired, limited or restricted   Donavan Burnet, MS [facility] SLP [phone]

## 2012-07-09 ENCOUNTER — Ambulatory Visit (HOSPITAL_COMMUNITY)

## 2012-07-10 IMAGING — CT CT HEAD W/O CM
1 series · 16 of 30 positions shown, 20 images · non-contrast
Comparison: 07/29/2005

CLINICAL DATA: Abrasion post fall.  Dementia.

CT HEAD WITHOUT CONTRAST
TECHNIQUE: Contiguous axial images were obtained from the base of
the skull through the vertex without contrast.

[Series 2: head 4.8 h37s · axial · 0.45mm/px · z∈[-145,-12]mm · 16 of 32 slices shown, 20 images]
[im 2/32  brain]
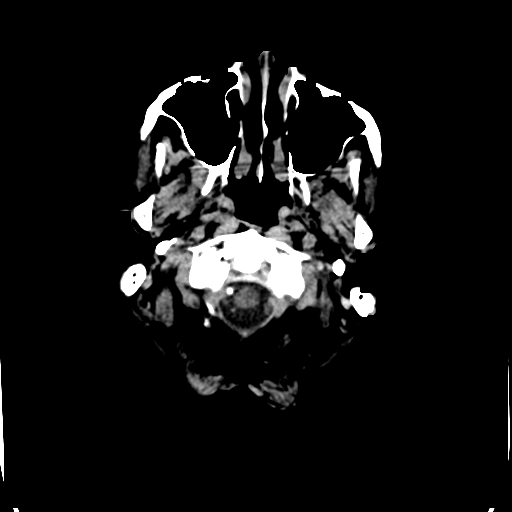
[im 2/32  bone]
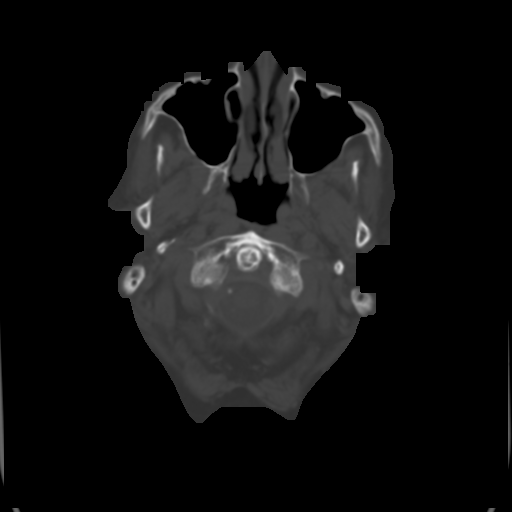
[im 4/32  brain]
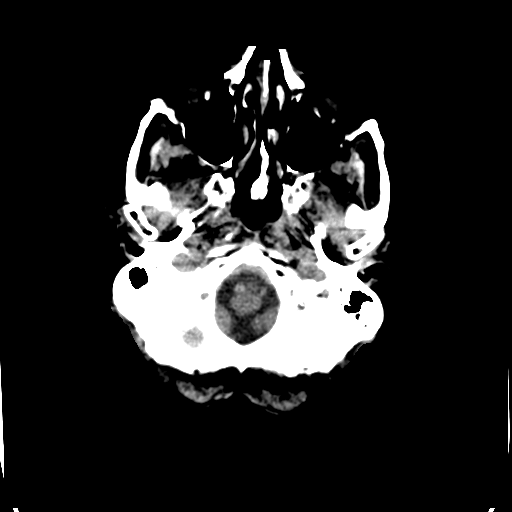
[im 6/32  brain]
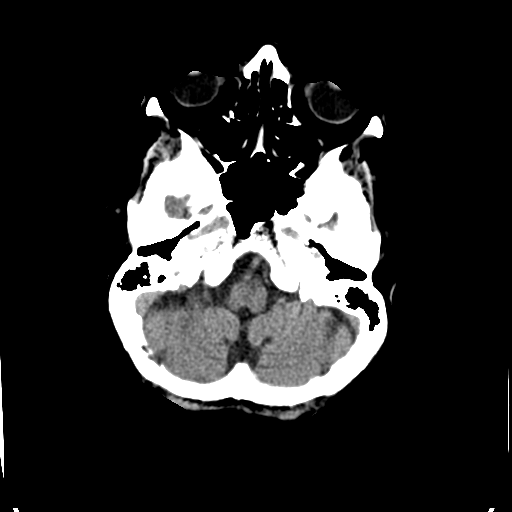
[im 8/32  brain]
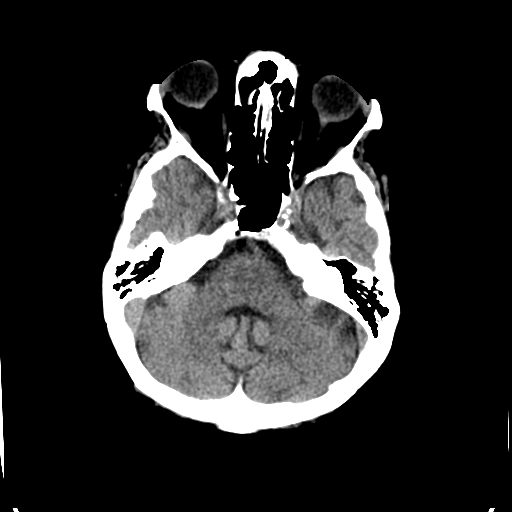
[im 9/32  brain]
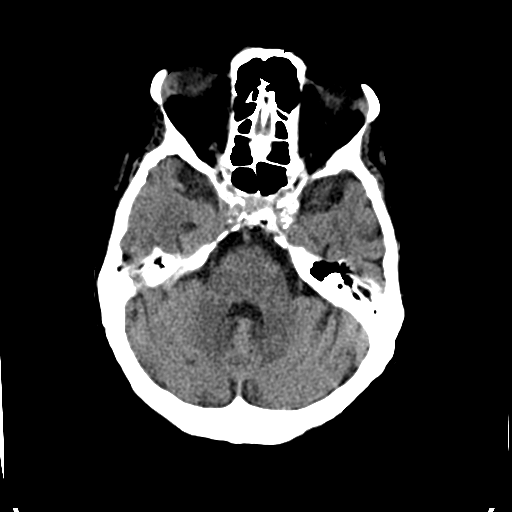
[im 9/32  bone]
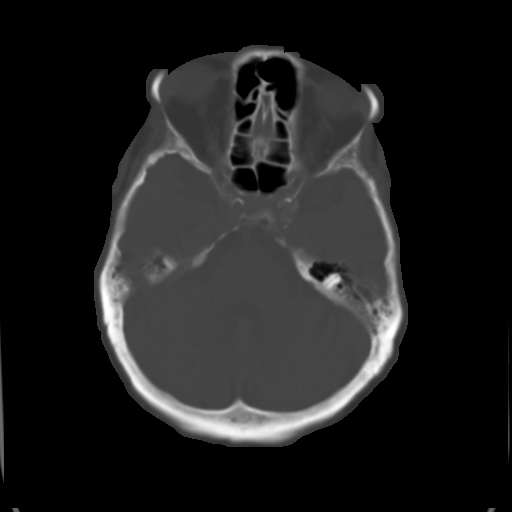
[im 11/32  brain]
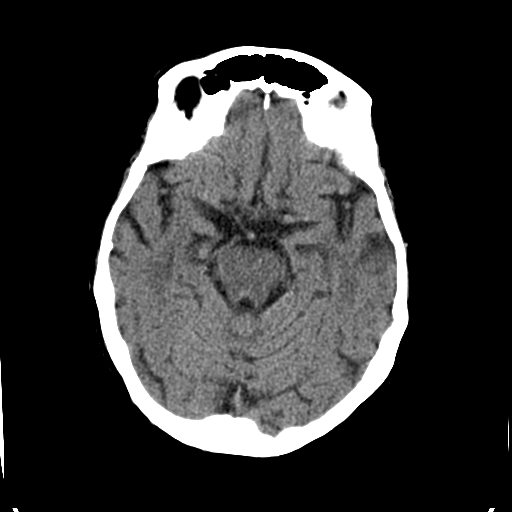
[im 13/32  brain]
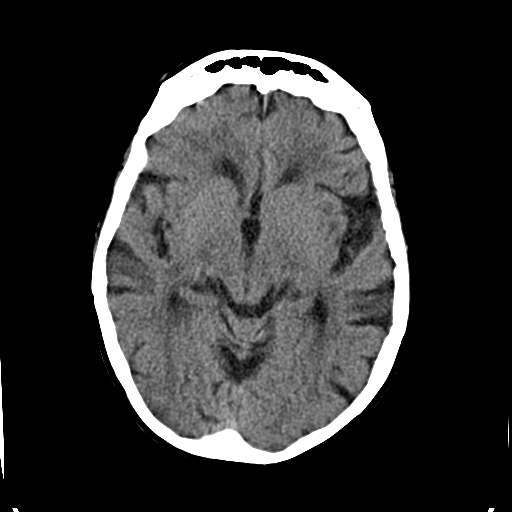
[im 15/32  brain]
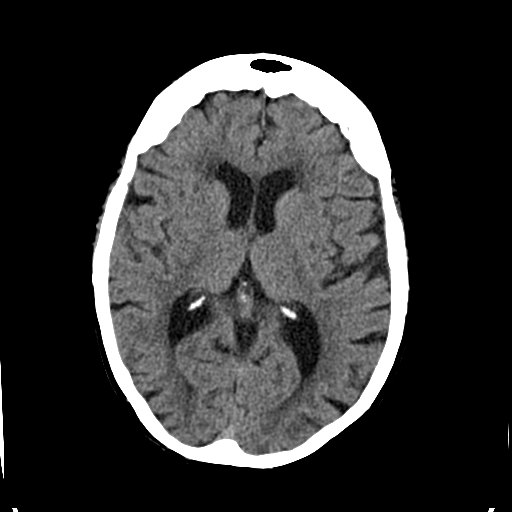
[im 17/32  brain]
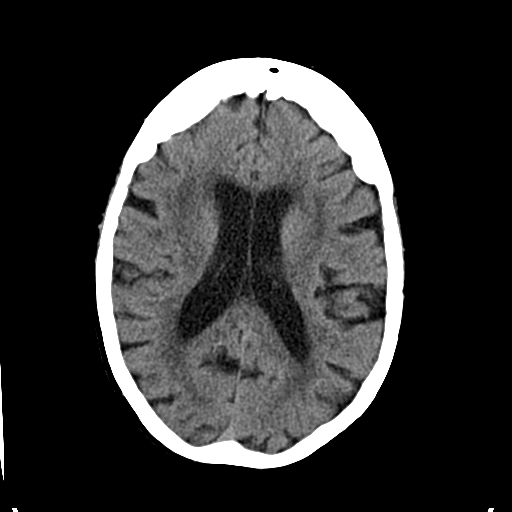
[im 17/32  bone]
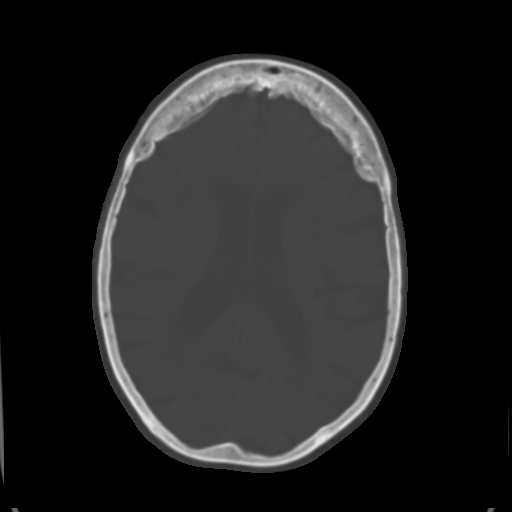
[im 19/32  brain]
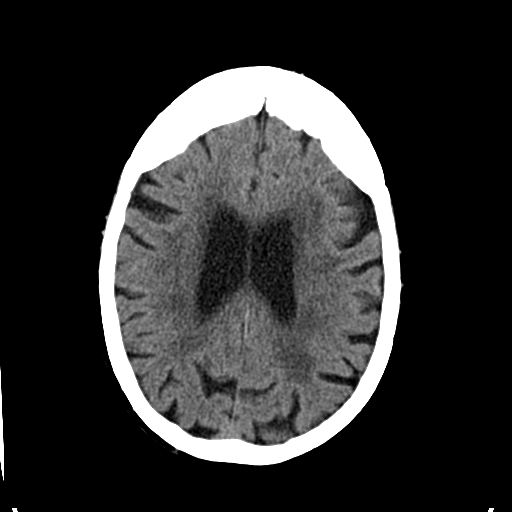
[im 21/32  brain]
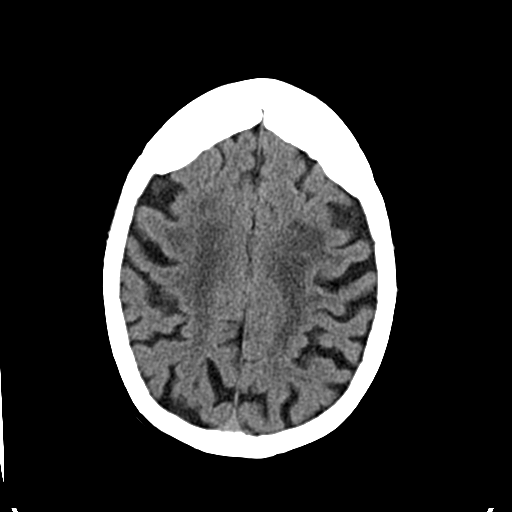
[im 23/32  brain]
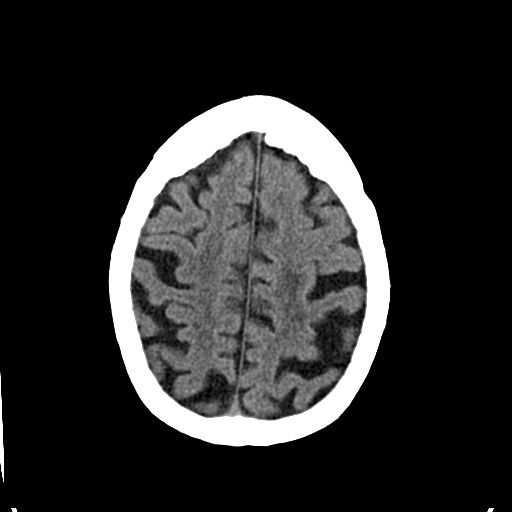
[im 24/32  brain]
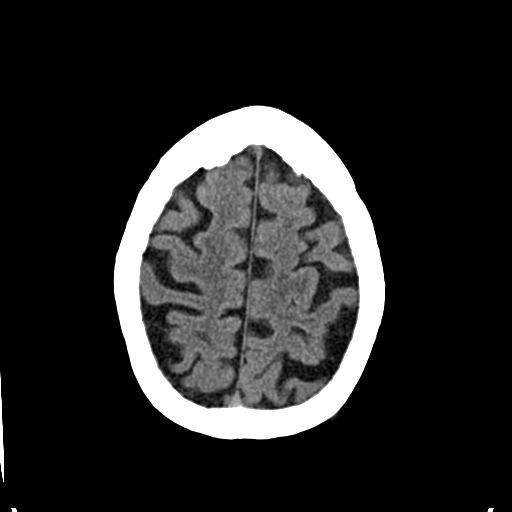
[im 24/32  bone]
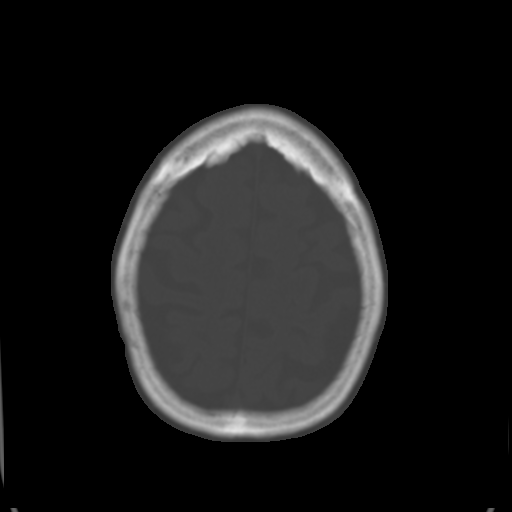
[im 26/32  brain]
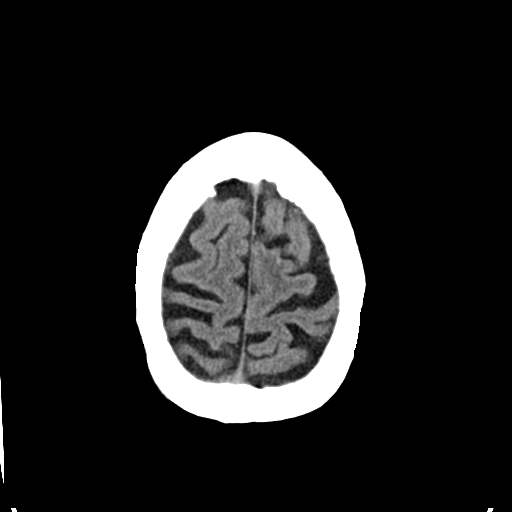
[im 28/32  brain]
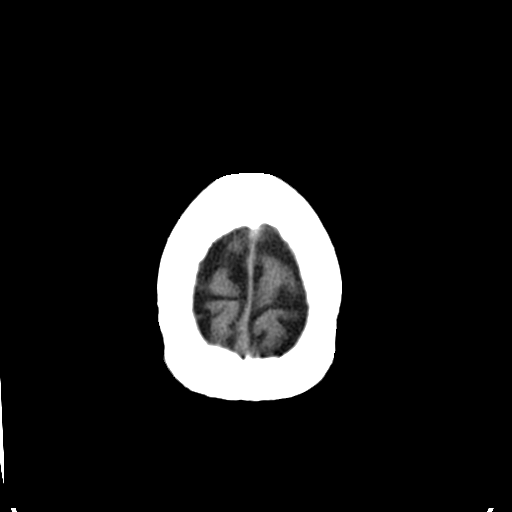
[im 30/32  brain]
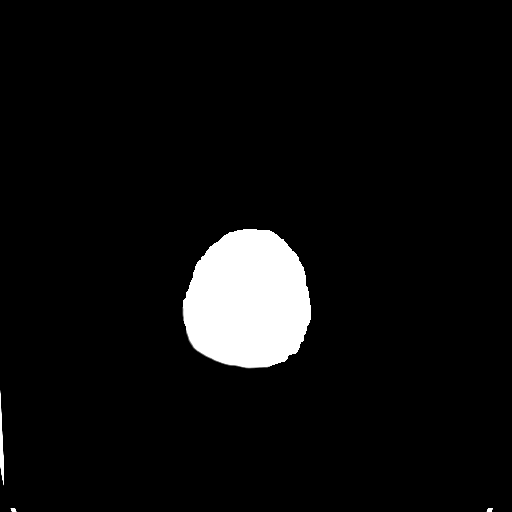

[16 of 30 positions shown; findings below may reference images not displayed]

FINDINGS: Atherosclerotic and physiologic intracranial
calcifications. Diffuse parenchymal atrophy. Patchy areas of
hypoattenuation in deep and periventricular white matter
bilaterally. Negative for acute intracranial hemorrhage, mass
lesion, acute infarction, midline shift, or mass-effect. Acute
infarct may be inapparent on noncontrast CT. Ventricles and sulci
symmetric. Bone windows demonstrate no focal lesion.
IMPRESSION: 1. Negative for bleed or other acute intracranial process.

2. Atrophy and nonspecific white matter changes

## 2012-09-19 ENCOUNTER — Encounter (HOSPITAL_BASED_OUTPATIENT_CLINIC_OR_DEPARTMENT_OTHER): Payer: Self-pay | Admitting: Student

## 2012-09-19 ENCOUNTER — Emergency Department (HOSPITAL_BASED_OUTPATIENT_CLINIC_OR_DEPARTMENT_OTHER)
Admission: EM | Admit: 2012-09-19 | Discharge: 2012-09-19 | Disposition: A | Discharge status: Skilled Nursing Facility | Payer: Medicare Other | Attending: Emergency Medicine | Admitting: Emergency Medicine

## 2012-09-19 DIAGNOSIS — Z8619 Personal history of other infectious and parasitic diseases: Secondary | ICD-10-CM | POA: Insufficient documentation

## 2012-09-19 DIAGNOSIS — I10 Essential (primary) hypertension: Secondary | ICD-10-CM | POA: Insufficient documentation

## 2012-09-19 DIAGNOSIS — Z8742 Personal history of other diseases of the female genital tract: Secondary | ICD-10-CM | POA: Insufficient documentation

## 2012-09-19 DIAGNOSIS — Z79899 Other long term (current) drug therapy: Secondary | ICD-10-CM | POA: Insufficient documentation

## 2012-09-19 DIAGNOSIS — Z8639 Personal history of other endocrine, nutritional and metabolic disease: Secondary | ICD-10-CM | POA: Insufficient documentation

## 2012-09-19 DIAGNOSIS — F329 Major depressive disorder, single episode, unspecified: Secondary | ICD-10-CM | POA: Insufficient documentation

## 2012-09-19 DIAGNOSIS — R61 Generalized hyperhidrosis: Secondary | ICD-10-CM | POA: Insufficient documentation

## 2012-09-19 DIAGNOSIS — F3289 Other specified depressive episodes: Secondary | ICD-10-CM | POA: Insufficient documentation

## 2012-09-19 DIAGNOSIS — Z139 Encounter for screening, unspecified: Secondary | ICD-10-CM

## 2012-09-19 DIAGNOSIS — Z87448 Personal history of other diseases of urinary system: Secondary | ICD-10-CM | POA: Insufficient documentation

## 2012-09-19 DIAGNOSIS — D649 Anemia, unspecified: Secondary | ICD-10-CM | POA: Insufficient documentation

## 2012-09-19 DIAGNOSIS — E539 Vitamin B deficiency, unspecified: Secondary | ICD-10-CM | POA: Insufficient documentation

## 2012-09-19 DIAGNOSIS — Z8701 Personal history of pneumonia (recurrent): Secondary | ICD-10-CM | POA: Insufficient documentation

## 2012-09-19 DIAGNOSIS — Z1389 Encounter for screening for other disorder: Secondary | ICD-10-CM | POA: Insufficient documentation

## 2012-09-19 DIAGNOSIS — Z8719 Personal history of other diseases of the digestive system: Secondary | ICD-10-CM | POA: Insufficient documentation

## 2012-09-19 DIAGNOSIS — Z862 Personal history of diseases of the blood and blood-forming organs and certain disorders involving the immune mechanism: Secondary | ICD-10-CM | POA: Insufficient documentation

## 2012-09-19 DIAGNOSIS — F0390 Unspecified dementia without behavioral disturbance: Secondary | ICD-10-CM | POA: Insufficient documentation

## 2012-09-19 DIAGNOSIS — F39 Unspecified mood [affective] disorder: Secondary | ICD-10-CM | POA: Insufficient documentation

## 2012-09-19 DIAGNOSIS — Z792 Long term (current) use of antibiotics: Secondary | ICD-10-CM | POA: Insufficient documentation

## 2012-09-19 DIAGNOSIS — G4700 Insomnia, unspecified: Secondary | ICD-10-CM | POA: Insufficient documentation

## 2012-09-19 NOTE — ED Notes (Signed)
Waiting for [facility] transport. 

## 2012-09-19 NOTE — Discharge Instructions (Signed)
 Needs evaluation by primary care physician and/or OB/GYN in follow up

## 2012-09-19 NOTE — ED Provider Notes (Signed)
History     CSN: [id number]  Arrival date & time 09/19/12  1127   First MD Initiated Contact with Patient 09/19/12 1138      Chief Complaint  Patient presents with  . Vaginal Prolapse    (Consider location/radiation/quality/duration/timing/severity/associated sxs/prior treatment) HPI Comments: Report from nursing facility, [facility] that pt has vaginal or uterine prolapse per EMS.  Pt has prior h/o vaginal prolapse.  Pt has severe dementia, is upset at being here, will intermittent scream at nursing staff.  Pt tells me, "I didn't come here to be examined down there!"  She denies any pain, abd pain, pelvic pain.      The history is provided by the EMS personnel.    Past Medical History  Diagnosis Date  . Dementia     with psychosis  . Insomnia   . Anemia   . Mood disorder   . Uterine prolapse   . Vaginal prolapse   . Cystocele   . Incontinence   . Dysphagia     pureed diet  . Prediabetes   . Tinea cruris   . Weight loss     03/2011  . Vitamin B deficiency   . Right thyroid nodule   . Pneumonia     10/2010  . Dementia   . HTN (hypertension)   . Depression     History reviewed. No pertinent past surgical history.  History reviewed. No pertinent family history.  History  Substance Use Topics  . Smoking status: Unknown If Ever Smoked  . Smokeless tobacco: Not on file  . Alcohol Use: No    OB History   Grav Para Term Preterm Abortions TAB SAB Ect Mult Living                  Review of Systems  Unable to perform ROS: Dementia    Allergies  Chlorpromazine and Thorazine  Home Medications   Current Outpatient Rx  Name  Route  Sig  Dispense  Refill  . acetaminophen (TYLENOL) 650 MG CR tablet   Oral   Take 650 mg by mouth every 8 (eight) hours as needed.           Marland Kitchen albuterol (ACCUNEB) 1.25 MG/3ML nebulizer solution   Nebulization   Take 1 ampule by nebulization every 6 (six) hours as needed. Patient takes 3 ml as needed bid          .  azithromycin (ZITHROMAX) 250 MG tablet   Oral   Take 250 mg by mouth daily.           . bisoprolol (ZEBETA) 5 MG tablet   Oral   Take 5 mg by mouth daily.           . calcium-vitamin D (OSCAL WITH D) 500-200 MG-UNIT per tablet   Oral   Take 1 tablet by mouth daily.         . cetirizine (ZYRTEC) 10 MG tablet   Oral   Take 10 mg by mouth daily.         . cholecalciferol (VITAMIN D) 1000 UNITS tablet   Oral   Take 1,000 Units by mouth daily.           . cholecalciferol (VITAMIN D) 400 UNITS TABS   Oral   Take by mouth.           . cyanocobalamin 1000 MCG tablet   Oral   Take 100 mcg by mouth daily.           Marland Kitchen  dextromethorphan-guaiFENesin (ROBITUSSIN-DM) 10-100 MG/5ML liquid   Oral   Take 5 mLs by mouth every 4 (four) hours as needed.           . divalproex (DEPAKOTE SPRINKLE) 125 MG capsule   Oral   Take 125 mg by mouth 2 (two) times daily.           Marland Kitchen donepezil (ARICEPT) 10 MG tablet   Oral   Take 10 mg by mouth at bedtime as needed.           . donepezil (ARICEPT) 10 MG tablet   Oral   Take 10 mg by mouth at bedtime as needed.         . ENSURE (ENSURE)   Oral   Take 237 mLs by mouth.           . loperamide (IMODIUM A-D) 2 MG tablet   Oral   Take 2 mg by mouth 4 (four) times daily as needed.           Marland Kitchen LORazepam (ATIVAN) 0.5 MG tablet   Oral   Take 0.5 mg by mouth every 8 (eight) hours.           . megestrol (MEGACE ES) 625 MG/5ML suspension   Oral   Take 625 mg by mouth daily.           . memantine (NAMENDA) 10 MG tablet   Oral   Take 10 mg by mouth 2 (two) times daily.           . memantine (NAMENDA) 10 MG tablet   Oral   Take 10 mg by mouth 2 (two) times daily.         . Multiple Vitamin (MULTIVITAMIN) capsule   Oral   Take 1 capsule by mouth daily.         . Multiple Vitamins-Iron (MULTIVITAMINS WITH IRON) TABS   Oral   Take 1 tablet by mouth daily.           . Multiple Vitamins-Minerals  (MULTIVITAMIN WITH MINERALS) tablet   Oral   Take 1 tablet by mouth daily.           Marland Kitchen nystatin (MYCOSTATIN) powder   Topical   Apply topically 2 (two) times daily.           Marland Kitchen olopatadine (PATANOL) 0.1 % ophthalmic solution      1 drop 2 (two) times daily.           Marland Kitchen omeprazole (PRILOSEC) 20 MG capsule   Oral   Take 20 mg by mouth daily.           Marland Kitchen omeprazole (PRILOSEC) 20 MG capsule   Oral   Take 20 mg by mouth daily.         . permethrin (ELIMITE) 5 % cream   Topical   Apply topically once.           . risperiDONE (RISPERDAL) 1 MG tablet   Oral   Take 1 mg by mouth 2 (two) times daily.           Marland Kitchen senna (SENOKOT) 8.6 MG TABS   Oral   Take 1 tablet by mouth.           . talc powder   Topical   Apply topically as needed.           . traZODone (DESYREL) 100 MG tablet   Oral   Take 100 mg by mouth at bedtime.           Marland Kitchen  traZODone (DESYREL) 50 MG tablet   Oral   Take 50 mg by mouth at bedtime.           . vitamin C (ASCORBIC ACID) 500 MG tablet   Oral   Take 500 mg by mouth daily.           . vitamin C (ASCORBIC ACID) 500 MG tablet   Oral   Take 500 mg by mouth daily.           BP 190/86  Pulse 63  Temp(Src) 97.6 F (36.4 C) (Axillary)  Resp 16  Wt 150 lb (68.04 kg)  SpO2 99%  Physical Exam  Nursing note and vitals reviewed. Constitutional: She appears well-developed and well-nourished. No distress.  Pulmonary/Chest: Effort normal. No respiratory distress.  Abdominal: Soft. She exhibits no distension. There is no tenderness.  Genitourinary: Pelvic exam was performed with patient prone. There is no tenderness or lesion on the right labia. There is no tenderness or lesion on the left labia. No vaginal discharge found.  Chaperone present  Neurological: She is alert.  Skin: Skin is warm. No rash noted. She is diaphoretic.    ED Course  Procedures (including critical care time)  Labs Reviewed - No data to display No  results found.   1. Screening       MDM  No significant prolapse seen on my exam in laying, prone position.  Pt denies any pain.  I think pt needs outpt evaluation by PCP and/or GYN.  Pt is screened, no emergency is present.        Gavin Pound. Meghana Tullo, MD 09/19/12 1142

## 2012-09-19 NOTE — ED Notes (Signed)
Pt in via EMS from [facility] with c/o prolapse uterus x 1 day. Prior HX of prolapsed uterus 4 years ago.

## 2012-10-18 ENCOUNTER — Emergency Department (HOSPITAL_BASED_OUTPATIENT_CLINIC_OR_DEPARTMENT_OTHER): Payer: Medicare Other

## 2012-10-18 ENCOUNTER — Emergency Department (HOSPITAL_BASED_OUTPATIENT_CLINIC_OR_DEPARTMENT_OTHER)
Admission: EM | Admit: 2012-10-18 | Discharge: 2012-10-18 | Disposition: A | Discharge status: Home / Self Care | Payer: Medicare Other | Attending: Emergency Medicine | Admitting: Emergency Medicine

## 2012-10-18 ENCOUNTER — Encounter (HOSPITAL_BASED_OUTPATIENT_CLINIC_OR_DEPARTMENT_OTHER): Payer: Self-pay

## 2012-10-18 DIAGNOSIS — Z8639 Personal history of other endocrine, nutritional and metabolic disease: Secondary | ICD-10-CM | POA: Insufficient documentation

## 2012-10-18 DIAGNOSIS — R634 Abnormal weight loss: Secondary | ICD-10-CM | POA: Insufficient documentation

## 2012-10-18 DIAGNOSIS — IMO0002 Reserved for concepts with insufficient information to code with codable children: Secondary | ICD-10-CM | POA: Insufficient documentation

## 2012-10-18 DIAGNOSIS — Z792 Long term (current) use of antibiotics: Secondary | ICD-10-CM | POA: Insufficient documentation

## 2012-10-18 DIAGNOSIS — F329 Major depressive disorder, single episode, unspecified: Secondary | ICD-10-CM | POA: Insufficient documentation

## 2012-10-18 DIAGNOSIS — I10 Essential (primary) hypertension: Secondary | ICD-10-CM | POA: Insufficient documentation

## 2012-10-18 DIAGNOSIS — Z8742 Personal history of other diseases of the female genital tract: Secondary | ICD-10-CM | POA: Insufficient documentation

## 2012-10-18 DIAGNOSIS — L0390 Cellulitis, unspecified: Secondary | ICD-10-CM

## 2012-10-18 DIAGNOSIS — R7309 Other abnormal glucose: Secondary | ICD-10-CM | POA: Insufficient documentation

## 2012-10-18 DIAGNOSIS — T148XXA Other injury of unspecified body region, initial encounter: Secondary | ICD-10-CM

## 2012-10-18 DIAGNOSIS — E539 Vitamin B deficiency, unspecified: Secondary | ICD-10-CM | POA: Insufficient documentation

## 2012-10-18 DIAGNOSIS — F3289 Other specified depressive episodes: Secondary | ICD-10-CM | POA: Insufficient documentation

## 2012-10-18 DIAGNOSIS — F0390 Unspecified dementia without behavioral disturbance: Secondary | ICD-10-CM | POA: Insufficient documentation

## 2012-10-18 DIAGNOSIS — Z8701 Personal history of pneumonia (recurrent): Secondary | ICD-10-CM | POA: Insufficient documentation

## 2012-10-18 DIAGNOSIS — Y929 Unspecified place or not applicable: Secondary | ICD-10-CM | POA: Insufficient documentation

## 2012-10-18 DIAGNOSIS — Y939 Activity, unspecified: Secondary | ICD-10-CM | POA: Insufficient documentation

## 2012-10-18 DIAGNOSIS — Z8669 Personal history of other diseases of the nervous system and sense organs: Secondary | ICD-10-CM | POA: Insufficient documentation

## 2012-10-18 DIAGNOSIS — Z79899 Other long term (current) drug therapy: Secondary | ICD-10-CM | POA: Insufficient documentation

## 2012-10-18 DIAGNOSIS — Z862 Personal history of diseases of the blood and blood-forming organs and certain disorders involving the immune mechanism: Secondary | ICD-10-CM | POA: Insufficient documentation

## 2012-10-18 DIAGNOSIS — F39 Unspecified mood [affective] disorder: Secondary | ICD-10-CM | POA: Insufficient documentation

## 2012-10-18 DIAGNOSIS — Z87448 Personal history of other diseases of urinary system: Secondary | ICD-10-CM | POA: Insufficient documentation

## 2012-10-18 DIAGNOSIS — S8010XA Contusion of unspecified lower leg, initial encounter: Secondary | ICD-10-CM | POA: Insufficient documentation

## 2012-10-18 DIAGNOSIS — Z8619 Personal history of other infectious and parasitic diseases: Secondary | ICD-10-CM | POA: Insufficient documentation

## 2012-10-18 LAB — CBC WITH DIFFERENTIAL/PLATELET
Eosinophils Absolute: 0.2 10*3/uL (ref 0.0–0.7)
HCT: 30.7 % — ABNORMAL LOW (ref 36.0–46.0)
Hemoglobin: 9.8 g/dL — ABNORMAL LOW (ref 12.0–15.0)
Lymphs Abs: 1.6 10*3/uL (ref 0.7–4.0)
MCH: 30.4 pg (ref 26.0–34.0)
Monocytes Absolute: 0.5 10*3/uL (ref 0.1–1.0)
Monocytes Relative: 9 % (ref 3–12)
Neutro Abs: 3.4 10*3/uL (ref 1.7–7.7)
Neutrophils Relative %: 60 % (ref 43–77)
RBC: 3.22 MIL/uL — ABNORMAL LOW (ref 3.87–5.11)

## 2012-10-18 LAB — BASIC METABOLIC PANEL
BUN: 29 mg/dL — ABNORMAL HIGH (ref 6–23)
Chloride: 104 mEq/L (ref 96–112)
Creatinine, Ser: 1 mg/dL (ref 0.50–1.10)
Glucose, Bld: 92 mg/dL (ref 70–99)
Potassium: 3.8 mEq/L (ref 3.5–5.1)

## 2012-10-18 MED ORDER — DOXYCYCLINE HYCLATE 100 MG PO CAPS: 100.0000 mg | ORAL_CAPSULE | Freq: Two times a day (BID) | ORAL | Status: DC

## 2012-10-18 MED ORDER — LORAZEPAM 2 MG/ML IJ SOLN
0.5000 mg | Freq: Once | INTRAMUSCULAR | Status: AC
  Administered 2012-10-18: 0.5 mg via INTRAVENOUS
  Filled 2012-10-18: qty 1

## 2012-10-18 NOTE — ED Notes (Signed)
Patient transported to Ultrasound 

## 2012-10-18 NOTE — ED Notes (Signed)
[facility] called for transport to facility.  PO fluids provided.

## 2012-10-18 NOTE — ED Notes (Signed)
Daughter informed of pt status.

## 2012-10-18 NOTE — ED Notes (Signed)
Consent obtained from daughter Staci Righter for I&D via telephone.

## 2012-10-18 NOTE — ED Provider Notes (Addendum)
History    CSN: [id number] Arrival date & time 10/18/12  1344  First MD Initiated Contact with Patient 10/18/12 1350     Chief Complaint  Patient presents with  . Leg Swelling   (Consider location/radiation/quality/duration/timing/severity/associated sxs/prior Treatment) HPI Comments: Patient presents to the ER for evaluation of redness and swelling of the right lower leg. Apparently symptoms were first noticed a week ago. There was a small area of redness at that point, today it was noted that the redness is worse and there is more swelling. Patient reports that she bumped it on a concrete step last week. She has not had any chest pain, shortness of breath. No fever.  Past Medical History  Diagnosis Date  . Dementia     with psychosis  . Insomnia   . Anemia   . Mood disorder   . Uterine prolapse   . Vaginal prolapse   . Cystocele   . Incontinence   . Dysphagia     pureed diet  . Prediabetes   . Tinea cruris   . Weight loss     03/2011  . Vitamin B deficiency   . Right thyroid nodule   . Pneumonia     10/2010  . Dementia   . HTN (hypertension)   . Depression    No past surgical history on file. No family history on file. History  Substance Use Topics  . Smoking status: Unknown If Ever Smoked  . Smokeless tobacco: Not on file  . Alcohol Use: No   OB History   Grav Para Term Preterm Abortions TAB SAB Ect Mult Living                 Review of Systems  Respiratory: Negative.   Cardiovascular: Negative.   Skin: Positive for color change.  All other systems reviewed and are negative.    Allergies  Chlorpromazine and Thorazine  Home Medications   Current Outpatient Rx  Name  Route  Sig  Dispense  Refill  . acetaminophen (TYLENOL) 650 MG CR tablet   Oral   Take 650 mg by mouth every 8 (eight) hours as needed.           Marland Kitchen albuterol (ACCUNEB) 1.25 MG/3ML nebulizer solution   Nebulization   Take 1 ampule by nebulization every 6 (six) hours as needed.  Patient takes 3 ml as needed bid          . azithromycin (ZITHROMAX) 250 MG tablet   Oral   Take 250 mg by mouth daily.           . bisoprolol (ZEBETA) 5 MG tablet   Oral   Take 5 mg by mouth daily.           . calcium-vitamin D (OSCAL WITH D) 500-200 MG-UNIT per tablet   Oral   Take 1 tablet by mouth daily.         . cetirizine (ZYRTEC) 10 MG tablet   Oral   Take 10 mg by mouth daily.         . cholecalciferol (VITAMIN D) 1000 UNITS tablet   Oral   Take 1,000 Units by mouth daily.           . cholecalciferol (VITAMIN D) 400 UNITS TABS   Oral   Take by mouth.           . cyanocobalamin 1000 MCG tablet   Oral   Take 100 mcg by mouth daily.           Marland Kitchen  dextromethorphan-guaiFENesin (ROBITUSSIN-DM) 10-100 MG/5ML liquid   Oral   Take 5 mLs by mouth every 4 (four) hours as needed.           . divalproex (DEPAKOTE SPRINKLE) 125 MG capsule   Oral   Take 125 mg by mouth 2 (two) times daily.           Marland Kitchen donepezil (ARICEPT) 10 MG tablet   Oral   Take 10 mg by mouth at bedtime as needed.           . donepezil (ARICEPT) 10 MG tablet   Oral   Take 10 mg by mouth at bedtime as needed.         . ENSURE (ENSURE)   Oral   Take 237 mLs by mouth.           . loperamide (IMODIUM A-D) 2 MG tablet   Oral   Take 2 mg by mouth 4 (four) times daily as needed.           Marland Kitchen LORazepam (ATIVAN) 0.5 MG tablet   Oral   Take 0.5 mg by mouth every 8 (eight) hours.           . megestrol (MEGACE ES) 625 MG/5ML suspension   Oral   Take 625 mg by mouth daily.           . memantine (NAMENDA) 10 MG tablet   Oral   Take 10 mg by mouth 2 (two) times daily.           . memantine (NAMENDA) 10 MG tablet   Oral   Take 10 mg by mouth 2 (two) times daily.         . Multiple Vitamin (MULTIVITAMIN) capsule   Oral   Take 1 capsule by mouth daily.         . Multiple Vitamins-Iron (MULTIVITAMINS WITH IRON) TABS   Oral   Take 1 tablet by mouth daily.            . Multiple Vitamins-Minerals (MULTIVITAMIN WITH MINERALS) tablet   Oral   Take 1 tablet by mouth daily.           Marland Kitchen nystatin (MYCOSTATIN) powder   Topical   Apply topically 2 (two) times daily.           Marland Kitchen olopatadine (PATANOL) 0.1 % ophthalmic solution      1 drop 2 (two) times daily.           Marland Kitchen omeprazole (PRILOSEC) 20 MG capsule   Oral   Take 20 mg by mouth daily.           Marland Kitchen omeprazole (PRILOSEC) 20 MG capsule   Oral   Take 20 mg by mouth daily.         . permethrin (ELIMITE) 5 % cream   Topical   Apply topically once.           . risperiDONE (RISPERDAL) 1 MG tablet   Oral   Take 1 mg by mouth 2 (two) times daily.           Marland Kitchen senna (SENOKOT) 8.6 MG TABS   Oral   Take 1 tablet by mouth.           . talc powder   Topical   Apply topically as needed.           . traZODone (DESYREL) 100 MG tablet   Oral   Take 100 mg by mouth at bedtime.           Marland Kitchen  traZODone (DESYREL) 50 MG tablet   Oral   Take 50 mg by mouth at bedtime.           . vitamin C (ASCORBIC ACID) 500 MG tablet   Oral   Take 500 mg by mouth daily.           . vitamin C (ASCORBIC ACID) 500 MG tablet   Oral   Take 500 mg by mouth daily.          BP 152/115  Pulse 58  Temp(Src) 97.9 F (36.6 C) (Oral)  Resp 16  Wt 150 lb (68.04 kg)  SpO2 99% Physical Exam  Constitutional: She is oriented to person, place, and time. She appears well-developed and well-nourished. No distress.  HENT:  Head: Normocephalic and atraumatic.  Right Ear: Hearing normal.  Left Ear: Hearing normal.  Nose: Nose normal.  Mouth/Throat: Oropharynx is clear and moist and mucous membranes are normal.  Eyes: Conjunctivae and EOM are normal. Pupils are equal, round, and reactive to light.  Neck: Normal range of motion. Neck supple.  Cardiovascular: Regular rhythm, S1 normal and S2 normal.  Exam reveals no gallop and no friction rub.   No murmur heard. Pulmonary/Chest: Effort normal and  breath sounds normal. No respiratory distress. She exhibits no tenderness.  Abdominal: Soft. Normal appearance and bowel sounds are normal. There is no hepatosplenomegaly. There is no tenderness. There is no rebound, no guarding, no tenderness at McBurney's point and negative Murphy's sign. No hernia.  Musculoskeletal: Normal range of motion.       Left lower leg: She exhibits swelling.       Legs: Neurological: She is alert and oriented to person, place, and time. She has normal strength. No cranial nerve deficit or sensory deficit. Coordination normal. GCS eye subscore is 4. GCS verbal subscore is 5. GCS motor subscore is 6.  Skin: Skin is warm, dry and intact. No rash noted. No cyanosis.  Psychiatric: She has a normal mood and affect. Her speech is normal and behavior is normal. Thought content normal.    ED Course  Procedures (including critical care time)  INCISION AND DRAINAGE Performed by: Gilda Crease. Consent: Verbal consent obtained. Risks and benefits: risks, benefits and alternatives were discussed Type: abscess  Body area: leg  Anesthesia: local infiltration  Incision was made with a scalpel.  Local anesthetic: lidocaine 1% without epinephrine  Anesthetic total: 3 ml  Complexity: complex Blunt dissection to break up loculations  Drainage: blood and clot  Drainage amount: moderate  Packing material: 1/4 in iodoform gauze  Patient tolerance: Patient tolerated the procedure well with no immediate complications.   Labs Reviewed  CBC WITH DIFFERENTIAL - Abnormal; Notable for the following:    RBC 3.22 (*)    Hemoglobin 9.8 (*)    HCT 30.7 (*)    All other components within normal limits  BASIC METABOLIC PANEL - Abnormal; Notable for the following:    BUN 29 (*)    GFR calc non Af Amer 49 (*)    GFR calc Af Amer 57 (*)    All other components within normal limits   No results found.  Diagnosis: Hematoma, cellulitis MDM  She presents with a  swollen area on the right shin that might have started as contusion, but now is red and warm. The area is fairly well circumscribed, some slight fluctuance noted.Cathlean Sauer shows soft tissue swelling without evidence of fracture. Previous evaluation performed to rule out DVT. No DVT seen.  Area of swelling does appear to be fluid-filled. Incision and drainage recommended. Small amount of blood and clot. Area, no purulence. Because of the increased risk of infection with a hematoma, iodoform packing was placed in the wound. To be removed Monday primary care doctor's office.  Patient's vital signs are normal, with no fever. Blood cell count 5.8. Patient treated with doxycycline, followup with primary doctor Monday morning. Return to the ER for worsening symptoms.  Gilda Crease, MD 10/18/12 1510  Gilda Crease, MD 10/18/12 1544

## 2012-10-18 NOTE — ED Notes (Signed)
Notified of transfer back to [facility]

## 2013-08-13 ENCOUNTER — Emergency Department (HOSPITAL_BASED_OUTPATIENT_CLINIC_OR_DEPARTMENT_OTHER)
Admission: EM | Admit: 2013-08-13 | Discharge: 2013-08-13 | Disposition: A | Discharge status: Skilled Nursing Facility | Payer: Medicare Other | Attending: Emergency Medicine | Admitting: Emergency Medicine

## 2013-08-13 ENCOUNTER — Emergency Department (HOSPITAL_BASED_OUTPATIENT_CLINIC_OR_DEPARTMENT_OTHER): Payer: Medicare Other

## 2013-08-13 ENCOUNTER — Encounter (HOSPITAL_BASED_OUTPATIENT_CLINIC_OR_DEPARTMENT_OTHER): Payer: Self-pay | Admitting: Emergency Medicine

## 2013-08-13 DIAGNOSIS — S8000XA Contusion of unspecified knee, initial encounter: Secondary | ICD-10-CM | POA: Insufficient documentation

## 2013-08-13 DIAGNOSIS — W19XXXA Unspecified fall, initial encounter: Secondary | ICD-10-CM | POA: Insufficient documentation

## 2013-08-13 DIAGNOSIS — E041 Nontoxic single thyroid nodule: Secondary | ICD-10-CM | POA: Insufficient documentation

## 2013-08-13 DIAGNOSIS — S139XXA Sprain of joints and ligaments of unspecified parts of neck, initial encounter: Secondary | ICD-10-CM | POA: Insufficient documentation

## 2013-08-13 DIAGNOSIS — S161XXA Strain of muscle, fascia and tendon at neck level, initial encounter: Secondary | ICD-10-CM

## 2013-08-13 DIAGNOSIS — Z862 Personal history of diseases of the blood and blood-forming organs and certain disorders involving the immune mechanism: Secondary | ICD-10-CM | POA: Insufficient documentation

## 2013-08-13 DIAGNOSIS — Z8659 Personal history of other mental and behavioral disorders: Secondary | ICD-10-CM | POA: Insufficient documentation

## 2013-08-13 DIAGNOSIS — Y92009 Unspecified place in unspecified non-institutional (private) residence as the place of occurrence of the external cause: Secondary | ICD-10-CM | POA: Insufficient documentation

## 2013-08-13 DIAGNOSIS — F0390 Unspecified dementia without behavioral disturbance: Secondary | ICD-10-CM | POA: Insufficient documentation

## 2013-08-13 DIAGNOSIS — Y9389 Activity, other specified: Secondary | ICD-10-CM | POA: Insufficient documentation

## 2013-08-13 DIAGNOSIS — I10 Essential (primary) hypertension: Secondary | ICD-10-CM | POA: Insufficient documentation

## 2013-08-13 DIAGNOSIS — S0990XA Unspecified injury of head, initial encounter: Secondary | ICD-10-CM | POA: Insufficient documentation

## 2013-08-13 DIAGNOSIS — R011 Cardiac murmur, unspecified: Secondary | ICD-10-CM | POA: Insufficient documentation

## 2013-08-13 DIAGNOSIS — Z8701 Personal history of pneumonia (recurrent): Secondary | ICD-10-CM | POA: Insufficient documentation

## 2013-08-13 DIAGNOSIS — Z79899 Other long term (current) drug therapy: Secondary | ICD-10-CM | POA: Insufficient documentation

## 2013-08-13 DIAGNOSIS — Z872 Personal history of diseases of the skin and subcutaneous tissue: Secondary | ICD-10-CM | POA: Insufficient documentation

## 2013-08-13 DIAGNOSIS — Z8742 Personal history of other diseases of the female genital tract: Secondary | ICD-10-CM | POA: Insufficient documentation

## 2013-08-13 NOTE — ED Notes (Signed)
[facility] contacted for transport back to SNF. [facility]

## 2013-08-13 NOTE — ED Notes (Signed)
Per ems pt was found in floor by the staff, no obvious injuries, denies pain.

## 2013-08-13 NOTE — Discharge Instructions (Signed)
Fall Prevention in Hospitals  As a hospital patient, your condition and the treatments you receive can increase your risk for falls. Some additional risk factors for falls in a hospital include:   Being in an unfamiliar environment.   Being on bed rest.   Your surgery.   Taking certain medicines.   Your tubing requirements, such as intravenous (IV) therapy or catheters.  It is important that you learn how to decrease fall risks while at the hospital. Below are important tips that can help prevent falls.  SAFETY TIPS FOR PREVENTING FALLS  Talk about your risk of falling.   Ask your caregiver why you are at risk for falling. Is it your medicine, illness, tubing placement, or something else?   Make a plan with your caregiver to keep you safe from falls.   Ask your caregiver or pharmacist about side effect of your medicines. Some medicines can make you dizzy or affect your coordination.  Ask for help.   Ask for help before getting out of bed. You may need to press your call button.   Ask for assistance in getting you safely to the toilet.   Ask for a walker or cane to be put at your bedside. Ask that most of the side rails on your bed be placed up before your caregiver leaves the room.   Ask family or friends to sit with you.   Ask for things that are out of your reach, such as your glasses, hearing aids, telephone, bedside table, or call button.  Follow these tips to avoid falling:   Stay lying or seated, rather than standing, while waiting for help.   Wear rubber-soled slippers or shoes whenever you walk in the hospital.   Avoid quick, sudden movements.   Change positions slowly.   Sit on the side of your bed before standing.   Stand up slowly and wait before you start to walk.   Let your caregiver know if there is a spill on the floor.   Pay careful attention to the medical equipment, electrical cords, and tubes around you.   When you need help, use your call button by your bed or in the  bathroom. Wait for one of your caregivers to help you.   If you feel dizzy or unsure of your footing, return to bed and wait for assistance.   Avoid being distracted by the TV, telephone, or another person in your room.   Do not lean or support yourself on rolling objects, such as IV poles or bedside tables.  Document Released: 03/17/2000 Document Revised: 03/06/2012 Document Reviewed: 11/26/2011  [employer] Patient Information 2014 [employer].

## 2013-08-13 NOTE — ED Provider Notes (Signed)
CSN: [id number]     Arrival date & time 08/13/13  [id number] History   First MD Initiated Contact with Patient 08/13/13 [phone]     Chief Complaint  Patient presents with  . Fall     (Consider location/radiation/quality/duration/timing/severity/associated sxs/prior Treatment) HPI Comments: Patient is an 80 year old female with history of dementia. She has a resident of a nursing home where she was found on the floor this morning. She denies to me she is experiencing any discomfort, however she does have a history of dementia and her history is somewhat unreliable. A level V caveat therefore applies.  Patient is a 80 y.o. female presenting with fall. The history is provided by the patient.  Fall This is a new problem. The current episode started less than 1 hour ago. The problem occurs constantly. The problem has not changed since onset.Nothing aggravates the symptoms. Nothing relieves the symptoms.    Past Medical History  Diagnosis Date  . Dementia     with psychosis  . Insomnia   . Anemia   . Mood disorder   . Uterine prolapse   . Vaginal prolapse   . Cystocele   . Incontinence   . Dysphagia     pureed diet  . Prediabetes   . Tinea cruris   . Weight loss     03/2011  . Vitamin B deficiency   . Right thyroid nodule   . Pneumonia     10/2010  . Dementia   . HTN (hypertension)   . Depression    History reviewed. No pertinent past surgical history. History reviewed. No pertinent family history. History  Substance Use Topics  . Smoking status: Unknown If Ever Smoked  . Smokeless tobacco: Not on file  . Alcohol Use: No   OB History   Grav Para Term Preterm Abortions TAB SAB Ect Mult Living                 Review of Systems  Unable to perform ROS     Allergies  Chlorpromazine and Thorazine  Home Medications   Prior to Admission medications   Medication Sig Start Date End Date Taking? Authorizing Provider  cetirizine (ZYRTEC) 10 MG tablet Take 10 mg by mouth at  bedtime.   Yes Historical Provider, MD  cholecalciferol (VITAMIN D) 1000 UNITS tablet Take 1,000 Units by mouth daily.     Yes Historical Provider, MD  donepezil (ARICEPT) 10 MG tablet Take 10 mg by mouth at bedtime as needed.     Yes Historical Provider, MD  ENSURE (ENSURE) Take 237 mLs by mouth.     Yes Historical Provider, MD  lisinopril (PRINIVIL,ZESTRIL) 2.5 MG tablet Take 2.5 mg by mouth daily.   Yes Historical Provider, MD  Memantine HCl ER (NAMENDA XR) 14 MG CP24 Take 1 tablet by mouth daily.   Yes Historical Provider, MD  Multiple Vitamin (MULTIVITAMIN) capsule Take 1 capsule by mouth daily.   Yes Historical Provider, MD  omeprazole (PRILOSEC) 20 MG capsule Take 20 mg by mouth daily.     Yes Historical Provider, MD  vitamin C (ASCORBIC ACID) 500 MG tablet Take 500 mg by mouth daily.     Yes Historical Provider, MD  donepezil (ARICEPT) 10 MG tablet Take 10 mg by mouth at bedtime as needed.    Historical Provider, MD  doxycycline (VIBRAMYCIN) 100 MG capsule Take 1 capsule (100 mg total) by mouth 2 (two) times daily. 10/18/12   Gilda Creasehristopher J. Pollina, MD  vitamin C (ASCORBIC ACID)  500 MG tablet Take 500 mg by mouth daily.    Historical Provider, MD   BP 149/80  Pulse 64  Temp(Src) 98.1 F (36.7 C) (Oral)  Resp 20  SpO2 100% Physical Exam  Nursing note and vitals reviewed. Constitutional: She appears well-developed and well-nourished. No distress.  Patient is an elderly female in no acute distress. She is awake and alert, however is somewhat confused.  HENT:  Head: Normocephalic and atraumatic.  Mouth/Throat: Oropharynx is clear and moist.  TMs are clear bilaterally.  Eyes: EOM are normal. Pupils are equal, round, and reactive to light.  Neck: Normal range of motion. Neck supple.  There is no bony tenderness and no step-off of the cervical spine. She is able to turn her head and appears to have full range of motion without discomfort.  Cardiovascular: Normal rate.   Murmur  heard. There is a 3/6 systolic ejection murmur heard best at the right upper sternal border.  Pulmonary/Chest: Effort normal and breath sounds normal. No respiratory distress. She has no wheezes.  Musculoskeletal: Normal range of motion. She exhibits no edema.  There are small contusions to the anterior aspect of both knees. They're otherwise stable she has good range of motion without significant discomfort. There is no palpation of the hips and she is able to flex, extend, internally and externally rotate her hips without limitation.  Neurological: She is alert.  The patient is alert. Neurologic exam is somewhat difficult due to dementia, however she does move all extremities and will follow commands.  Skin: Skin is warm and dry. She is not diaphoretic.    ED Course  Procedures (including critical care time) Labs Review Labs Reviewed - No data to display  Imaging Review No results found.   EKG Interpretation None      MDM   Final diagnoses:  None    Patient brought by EMS after a fall at a nursing home. She has a history of dementia and adds little history. CT of the head is unremarkable as is CT of the cervical spine. X-rays of the knees and pelvis reveals no acute abnormality. At this point I feel as though she has not sustained any serious injury and is appropriate for return to the extended care facility.    Geoffery Lyonsouglas Geralynn Capri, MD 08/13/13 0800

## 2013-08-14 ENCOUNTER — Emergency Department (HOSPITAL_BASED_OUTPATIENT_CLINIC_OR_DEPARTMENT_OTHER)
Admission: EM | Admit: 2013-08-14 | Discharge: 2013-08-14 | Disposition: A | Discharge status: Home / Self Care | Payer: Medicare Other | Attending: Emergency Medicine | Admitting: Emergency Medicine

## 2013-08-14 ENCOUNTER — Encounter (HOSPITAL_BASED_OUTPATIENT_CLINIC_OR_DEPARTMENT_OTHER): Payer: Self-pay | Admitting: Emergency Medicine

## 2013-08-14 ENCOUNTER — Emergency Department (HOSPITAL_BASED_OUTPATIENT_CLINIC_OR_DEPARTMENT_OTHER)
Admission: EM | Admit: 2013-08-14 | Discharge: 2013-08-14 | Disposition: A | Discharge status: Home / Self Care | Payer: Medicare Other | Source: Home / Self Care | Attending: Emergency Medicine | Admitting: Emergency Medicine

## 2013-08-14 ENCOUNTER — Emergency Department (HOSPITAL_BASED_OUTPATIENT_CLINIC_OR_DEPARTMENT_OTHER): Payer: Medicare Other

## 2013-08-14 ENCOUNTER — Emergency Department (HOSPITAL_BASED_OUTPATIENT_CLINIC_OR_DEPARTMENT_OTHER)
Admission: EM | Admit: 2013-08-14 | Discharge: 2013-08-15 | Disposition: A | Discharge status: Home / Self Care | Payer: Medicare Other | Attending: Emergency Medicine | Admitting: Emergency Medicine

## 2013-08-14 DIAGNOSIS — Y939 Activity, unspecified: Secondary | ICD-10-CM | POA: Insufficient documentation

## 2013-08-14 DIAGNOSIS — W19XXXA Unspecified fall, initial encounter: Secondary | ICD-10-CM

## 2013-08-14 DIAGNOSIS — Z792 Long term (current) use of antibiotics: Secondary | ICD-10-CM | POA: Insufficient documentation

## 2013-08-14 DIAGNOSIS — Z8639 Personal history of other endocrine, nutritional and metabolic disease: Secondary | ICD-10-CM | POA: Insufficient documentation

## 2013-08-14 DIAGNOSIS — Z79899 Other long term (current) drug therapy: Secondary | ICD-10-CM | POA: Insufficient documentation

## 2013-08-14 DIAGNOSIS — Z8742 Personal history of other diseases of the female genital tract: Secondary | ICD-10-CM | POA: Insufficient documentation

## 2013-08-14 DIAGNOSIS — N390 Urinary tract infection, site not specified: Secondary | ICD-10-CM

## 2013-08-14 DIAGNOSIS — I10 Essential (primary) hypertension: Secondary | ICD-10-CM

## 2013-08-14 DIAGNOSIS — Z862 Personal history of diseases of the blood and blood-forming organs and certain disorders involving the immune mechanism: Secondary | ICD-10-CM | POA: Insufficient documentation

## 2013-08-14 DIAGNOSIS — Z8659 Personal history of other mental and behavioral disorders: Secondary | ICD-10-CM | POA: Insufficient documentation

## 2013-08-14 DIAGNOSIS — Z043 Encounter for examination and observation following other accident: Secondary | ICD-10-CM

## 2013-08-14 DIAGNOSIS — S0180XA Unspecified open wound of other part of head, initial encounter: Secondary | ICD-10-CM | POA: Insufficient documentation

## 2013-08-14 DIAGNOSIS — H05231 Hemorrhage of right orbit: Secondary | ICD-10-CM

## 2013-08-14 DIAGNOSIS — Y929 Unspecified place or not applicable: Secondary | ICD-10-CM | POA: Insufficient documentation

## 2013-08-14 DIAGNOSIS — Z8701 Personal history of pneumonia (recurrent): Secondary | ICD-10-CM

## 2013-08-14 DIAGNOSIS — S79919A Unspecified injury of unspecified hip, initial encounter: Secondary | ICD-10-CM | POA: Insufficient documentation

## 2013-08-14 DIAGNOSIS — F0390 Unspecified dementia without behavioral disturbance: Secondary | ICD-10-CM | POA: Insufficient documentation

## 2013-08-14 DIAGNOSIS — Z8619 Personal history of other infectious and parasitic diseases: Secondary | ICD-10-CM | POA: Insufficient documentation

## 2013-08-14 DIAGNOSIS — M25551 Pain in right hip: Secondary | ICD-10-CM

## 2013-08-14 DIAGNOSIS — S79929A Unspecified injury of unspecified thigh, initial encounter: Secondary | ICD-10-CM

## 2013-08-14 DIAGNOSIS — S0510XA Contusion of eyeball and orbital tissues, unspecified eye, initial encounter: Secondary | ICD-10-CM | POA: Insufficient documentation

## 2013-08-14 DIAGNOSIS — S01111A Laceration without foreign body of right eyelid and periocular area, initial encounter: Secondary | ICD-10-CM

## 2013-08-14 HISTORY — DX: Depression, unspecified: F32.A

## 2013-08-14 HISTORY — DX: Major depressive disorder, single episode, unspecified: F32.9

## 2013-08-14 HISTORY — DX: Essential (primary) hypertension: I10

## 2013-08-14 LAB — URINE MICROSCOPIC-ADD ON

## 2013-08-14 LAB — URINALYSIS, ROUTINE W REFLEX MICROSCOPIC
Bilirubin Urine: NEGATIVE
GLUCOSE, UA: NEGATIVE mg/dL
Hgb urine dipstick: NEGATIVE
KETONES UR: NEGATIVE mg/dL
NITRITE: NEGATIVE
PROTEIN: 100 mg/dL — AB
Specific Gravity, Urine: 1.022 (ref 1.005–1.030)
UROBILINOGEN UA: 0.2 mg/dL (ref 0.0–1.0)
pH: 5 (ref 5.0–8.0)

## 2013-08-14 MED ORDER — CEPHALEXIN 500 MG PO CAPS: 500.0000 mg | ORAL_CAPSULE | Freq: Three times a day (TID) | ORAL | Status: DC

## 2013-08-14 NOTE — ED Provider Notes (Signed)
CSN: [id number]     Arrival date & time 08/14/13  1749 History   First MD Initiated Contact with Patient 08/14/13 1757     Chief Complaint  Patient presents with  . Fall     (Consider location/radiation/quality/duration/timing/severity/associated sxs/prior Treatment) Patient is a 80 y.o. female presenting with fall.  Fall   Pt with history of dementia brought from local SNF for evaluation of ?fall. No details are available. She was seen in ED for fall yesterday morning with normal imaging although it is unclear if the reason for this visit is the same or a different fall. Per EMS, [facility] that she be checked for UTI. Pt has no complaints.   Past Medical History  Diagnosis Date  . Dementia     with psychosis  . Insomnia   . Anemia   . Mood disorder   . Uterine prolapse   . Vaginal prolapse   . Cystocele   . Incontinence   . Dysphagia     pureed diet  . Prediabetes   . Tinea cruris   . Weight loss     03/2011  . Vitamin B deficiency   . Right thyroid nodule   . Pneumonia     10/2010  . Dementia   . HTN (hypertension)   . Depression    No past surgical history on file. No family history on file. History  Substance Use Topics  . Smoking status: Unknown If Ever Smoked  . Smokeless tobacco: Not on file  . Alcohol Use: No   OB History   Grav Para Term Preterm Abortions TAB SAB Ect Mult Living                 Review of Systems Unable to assess due to mental status.     Allergies  Chlorpromazine and Thorazine  Home Medications   Prior to Admission medications   Medication Sig Start Date End Date Taking? Authorizing Provider  cetirizine (ZYRTEC) 10 MG tablet Take 10 mg by mouth at bedtime.    Historical Provider, MD  cholecalciferol (VITAMIN D) 1000 UNITS tablet Take 1,000 Units by mouth daily.      Historical Provider, MD  donepezil (ARICEPT) 10 MG tablet Take 10 mg by mouth at bedtime as needed.      Historical Provider, MD   donepezil (ARICEPT) 10 MG tablet Take 10 mg by mouth at bedtime as needed.    Historical Provider, MD  doxycycline (VIBRAMYCIN) 100 MG capsule Take 1 capsule (100 mg total) by mouth 2 (two) times daily. 10/18/12   Gilda Creasehristopher J. Pollina, MD  ENSURE (ENSURE) Take 237 mLs by mouth.      Historical Provider, MD  lisinopril (PRINIVIL,ZESTRIL) 2.5 MG tablet Take 2.5 mg by mouth daily.    Historical Provider, MD  Memantine HCl ER (NAMENDA XR) 14 MG CP24 Take 1 tablet by mouth daily.    Historical Provider, MD  Multiple Vitamin (MULTIVITAMIN) capsule Take 1 capsule by mouth daily.    Historical Provider, MD  omeprazole (PRILOSEC) 20 MG capsule Take 20 mg by mouth daily.      Historical Provider, MD  vitamin C (ASCORBIC ACID) 500 MG tablet Take 500 mg by mouth daily.      Historical Provider, MD  vitamin C (ASCORBIC ACID) 500 MG tablet Take 500 mg by mouth daily.    Historical Provider, MD   BP 138/54  Pulse 67  Temp(Src) 96.8 F (36 C) (Oral)  Resp 18  SpO2 100% Physical Exam  Nursing note and vitals reviewed. Constitutional: She appears well-developed and well-nourished.  HENT:  Head: Normocephalic and atraumatic.  Eyes: EOM are normal. Pupils are equal, round, and reactive to light.  Neck: Normal range of motion. Neck supple.  Cardiovascular: Normal rate, normal heart sounds and intact distal pulses.   Pulmonary/Chest: Effort normal and breath sounds normal.  Abdominal: Bowel sounds are normal. She exhibits no distension. There is no tenderness.  Musculoskeletal: Normal range of motion. She exhibits no edema and no tenderness.  Neurological: She is alert. She has normal strength. No cranial nerve deficit or sensory deficit.  Skin: Skin is warm and dry. No rash noted.  Psychiatric: She has a normal mood and affect.    ED Course  Procedures (including critical care time) Labs Review Labs Reviewed  URINALYSIS, ROUTINE W REFLEX MICROSCOPIC - Abnormal; Notable for the following:     APPearance CLOUDY (*)    Protein, ur 100 (*)    Leukocytes, UA MODERATE (*)    All other components within normal limits  URINE MICROSCOPIC-ADD ON - Abnormal; Notable for the following:    Squamous Epithelial / LPF FEW (*)    Bacteria, UA MANY (*)    All other components within normal limits  URINE CULTURE    Imaging Review No imaging for this visit   EKG Interpretation None      MDM   Final diagnoses:  UTI (urinary tract infection)  Fall   Keflex for UTI. [facility]  [facility]. Bernette MayersSheldon, MD 08/14/13 [phone]

## 2013-08-14 NOTE — Discharge Instructions (Signed)
Urinary Tract Infection  Urinary tract infections (UTIs) can develop anywhere along your urinary tract. Your urinary tract is your body's drainage system for removing wastes and extra water. Your urinary tract includes two kidneys, two ureters, a bladder, and a urethra. Your kidneys are a pair of bean-shaped organs. Each kidney is about the size of your fist. They are located below your ribs, one on each side of your spine.  CAUSES  Infections are caused by microbes, which are microscopic organisms, including fungi, viruses, and bacteria. These organisms are so small that they can only be seen through a microscope. Bacteria are the microbes that most commonly cause UTIs.  SYMPTOMS   Symptoms of UTIs may vary by age and gender of the patient and by the location of the infection. Symptoms in young women typically include a frequent and intense urge to urinate and a painful, burning feeling in the bladder or urethra during urination. Older women and men are more likely to be tired, shaky, and weak and have muscle aches and abdominal pain. A fever may mean the infection is in your kidneys. Other symptoms of a kidney infection include pain in your back or sides below the ribs, nausea, and vomiting.  DIAGNOSIS  To diagnose a UTI, your caregiver will ask you about your symptoms. Your caregiver also will ask to provide a urine sample. The urine sample will be tested for bacteria and white blood cells. White blood cells are made by your body to help fight infection.  TREATMENT   Typically, UTIs can be treated with medication. Because most UTIs are caused by a bacterial infection, they usually can be treated with the use of antibiotics. The choice of antibiotic and length of treatment depend on your symptoms and the type of bacteria causing your infection.  HOME CARE INSTRUCTIONS   If you were prescribed antibiotics, take them exactly as your caregiver instructs you. Finish the medication even if you feel better after you  have only taken some of the medication.   Drink enough water and fluids to keep your urine clear or pale yellow.   Avoid caffeine, tea, and carbonated beverages. They tend to irritate your bladder.   Empty your bladder often. Avoid holding urine for long periods of time.   Empty your bladder before and after sexual intercourse.   After a bowel movement, women should cleanse from front to back. Use each tissue only once.  SEEK MEDICAL CARE IF:    You have back pain.   You develop a fever.   Your symptoms do not begin to resolve within 3 days.  SEEK IMMEDIATE MEDICAL CARE IF:    You have severe back pain or lower abdominal pain.   You develop chills.   You have nausea or vomiting.   You have continued burning or discomfort with urination.  MAKE SURE YOU:    Understand these instructions.   Will watch your condition.   Will get help right away if you are not doing well or get worse.  Document Released: 12/28/2004 Document Revised: 09/19/2011 Document Reviewed: 04/28/2011  [employer] Patient Information 2014 [employer].

## 2013-08-14 NOTE — ED Notes (Signed)
[facility] called for transport to [facility] 

## 2013-08-14 NOTE — ED Notes (Signed)
Pt in recliner at nursing station

## 2013-08-14 NOTE — ED Notes (Signed)
Fell this pm  Lac over rt eye1/4 inch  Bleeding controlled

## 2013-08-14 NOTE — ED Notes (Signed)
Pt fell at nursing home  approx 1/2 inch lac to rt eye brow  Bleeding controlled

## 2013-08-15 ENCOUNTER — Encounter (HOSPITAL_BASED_OUTPATIENT_CLINIC_OR_DEPARTMENT_OTHER): Payer: Self-pay | Admitting: Emergency Medicine

## 2013-08-15 ENCOUNTER — Emergency Department (HOSPITAL_BASED_OUTPATIENT_CLINIC_OR_DEPARTMENT_OTHER): Payer: Medicare Other

## 2013-08-15 NOTE — ED Notes (Signed)
MD at bedside. 

## 2013-08-15 NOTE — ED Notes (Signed)
Pt sitting at nursing station in recliner,

## 2013-08-15 NOTE — ED Provider Notes (Signed)
CSN: [id number]     Arrival date & time 08/14/13  2315 History   First MD Initiated Contact with Patient 08/15/13 0126     Chief Complaint  Patient presents with  . Fall     (Consider location/radiation/quality/duration/timing/severity/associated sxs/prior Treatment) HPI Level 5 Caveat: dementia. This is an 80 year old female with dementia who lives in a nursing home. This is her third visit in about 2 days for fall. She was seen yesterday evening for an alleged fall but no injury was found. She was found to have a urinary tract infection. She returns having apparently fallen again. She has a laceration to her right eyebrow is complaining of pain in her right hip or lower back when she tries to ambulate. She is confused but noted to be at her baseline by staff here familiar with her.  Past Medical History  Diagnosis Date  . Dementia     with psychosis  . Insomnia   . Anemia   . Mood disorder   . Uterine prolapse   . Vaginal prolapse   . Cystocele   . Incontinence   . Dysphagia     pureed diet  . Prediabetes   . Tinea cruris   . Weight loss     03/2011  . Vitamin B deficiency   . Right thyroid nodule   . Pneumonia     10/2010  . Dementia   . HTN (hypertension)   . Depression    History reviewed. No pertinent past surgical history. No family history on file. History  Substance Use Topics  . Smoking status: Unknown If Ever Smoked  . Smokeless tobacco: Not on file  . Alcohol Use: No   OB History   Grav Para Term Preterm Abortions TAB SAB Ect Mult Living                 Review of Systems  Unable to perform ROS  Allergies  Chlorpromazine and Thorazine  Home Medications   Prior to Admission medications   Medication Sig Start Date End Date Taking? Authorizing Provider  cephALEXin (KEFLEX) 500 MG capsule Take 1 capsule (500 mg total) by mouth 3 (three) times daily. 08/14/13   Charles B. Bernette Mayers, MD  cetirizine (ZYRTEC) 10 MG tablet Take 10 mg by mouth at bedtime.     Historical Provider, MD  cholecalciferol (VITAMIN D) 1000 UNITS tablet Take 1,000 Units by mouth daily.      Historical Provider, MD  donepezil (ARICEPT) 10 MG tablet Take 10 mg by mouth at bedtime as needed.      Historical Provider, MD  donepezil (ARICEPT) 10 MG tablet Take 10 mg by mouth at bedtime as needed.    Historical Provider, MD  doxycycline (VIBRAMYCIN) 100 MG capsule Take 1 capsule (100 mg total) by mouth 2 (two) times daily. 10/18/12   Gilda Crease, MD  ENSURE (ENSURE) Take 237 mLs by mouth.      Historical Provider, MD  lisinopril (PRINIVIL,ZESTRIL) 2.5 MG tablet Take 2.5 mg by mouth daily.    Historical Provider, MD  Memantine HCl ER (NAMENDA XR) 14 MG CP24 Take 1 tablet by mouth daily.    Historical Provider, MD  Multiple Vitamin (MULTIVITAMIN) capsule Take 1 capsule by mouth daily.    Historical Provider, MD  omeprazole (PRILOSEC) 20 MG capsule Take 20 mg by mouth daily.      Historical Provider, MD  vitamin C (ASCORBIC ACID) 500 MG tablet Take 500 mg by mouth daily.  Historical Provider, MD  vitamin C (ASCORBIC ACID) 500 MG tablet Take 500 mg by mouth daily.    Historical Provider, MD   BP 152/48  Pulse 72  Resp 22  SpO2 100%  Physical Exam General: Well-developed, well-nourished female in no acute distress; appearance consistent with age of record HENT: normocephalic; laceration of right eyebrow with right periorbital hematoma Eyes: pupils equal, round and reactive to light; extraocular muscles intact Neck: supple; full range of motion without pain; nontender Heart: regular rate and rhythm; no murmur Lungs: clear to auscultation bilaterally Abdomen: soft; nondistended; nontender; bowel sounds present Extremities: Arthritic changes without acute deformity; mild pain on range of motion of right hip, patient points to right lower back when she says this, no shortening or rotation of either lower extremity; pulses normal; patient able to bear weight on both  lower extremities but prefers to stay seated due to discomfort Neurologic: Awake, alert, confused; motor function intact in all extremities and symmetric; no facial droop Skin: Warm and dry Psychiatric: Mildly agitated    ED Course  Procedures (including critical care time)  LACERATION REPAIR Performed by: Carlisle BeersJohn L [place] Authorized by: Carlisle BeersJohn L Muscab Brenneman Consent: Verbal consent obtained. Risks and benefits: risks, benefits and alternatives were discussed Consent given by: patient Patient identity confirmed: provided demographic data Prepped and Draped in normal sterile fashion Wound explored  Laceration Location: Right eyebrow  Laceration Length: 2.5 cm  No Foreign Bodies seen or palpated  Anesthesia: None   Irrigation method: syringe Amount of cleaning: standard  Skin closure: Staples   Number of sutures: 3   Patient tolerance: Patient tolerated the procedure well with no immediate complications.   MDM   Nursing notes and vitals signs, including pulse oximetry, reviewed.  Summary of this visit's results, reviewed by myself:   Imaging Studies: Dg Lumbar Spine Complete  08/15/2013   CLINICAL DATA:  Larey SeatFell yesterday with right hip pain.  EXAM: LUMBAR SPINE - COMPLETE 4+ VIEW  COMPARISON:  DG PELVIS 1-2 VIEWS dated 08/13/2013; DG HIP COMPLETE*R* dated 08/14/2013  FINDINGS: Diffuse bone demineralization. Slight anterior subluxation of L4 on L5, likely degenerative. Degenerative changes throughout the lumbar spine with narrowed lumbar interspaces and associated endplate hypertrophic changes. Degenerative changes in the facet joints. No vertebral compression deformities. No focal bone lesion or bone destruction is appreciated. Vascular calcifications. Surgical clips in the right upper quadrant and right lower quadrant.  IMPRESSION: Diffuse bone demineralization and degenerative changes. No displaced fractures identified.   Electronically Signed   By: Burman NievesWilliam  Stevens M.D.   On:  08/15/2013 01:58   Dg Hip Complete Right  08/15/2013   CLINICAL DATA:  Right hip pain following injury  EXAM: RIGHT HIP - COMPLETE 2+ VIEW  COMPARISON:  None.  FINDINGS: There is no evidence of hip fracture or dislocation. There is no evidence of arthropathy or other focal bone abnormality.  IMPRESSION: No acute abnormality noted.   Electronically Signed   By: Alcide CleverMark  Lukens M.D.   On: 08/15/2013 00:07   Ct Head Wo Contrast  08/15/2013   CLINICAL DATA:  Recent injury  EXAM: CT HEAD WITHOUT CONTRAST  TECHNIQUE: Contiguous axial images were obtained from the base of the skull through the vertex without intravenous contrast.  COMPARISON:  None.  FINDINGS: Examination is significantly limited by patient motion artifact. No gross calvarial abnormality is seen. Diffuse atrophic changes are noted. Chronic white matter ischemic change is seen as well. No definitive hemorrhage, infarct or space-occupying mass lesion is noted.  IMPRESSION: Severely limited exam. No gross abnormality is noted. Chronic atrophic changes are seen.   Electronically Signed   By: Alcide CleverMark  Lukens M.D.   On: 08/15/2013 00:12   2:39 AM The patient's radiographs did not show evidence of a lumbar, pelvic or right hip fracture. On examination the patient appears to only have mild discomfort with range of motion of the right hip. Her pain is likely due to 2 lumbar strain or contusion of the hip. While seated she is in no significant discomfort. At this time I do not believe the patient has a fracture and we were able to return her to her living facility.   Hanley SeamenJohn L Marleena Shubert, MD 08/15/13 [phone]

## 2013-08-15 NOTE — ED Notes (Signed)
Report called to nursing home.

## 2013-08-16 LAB — URINE CULTURE: Colony Count: >=100000

## 2013-08-17 ENCOUNTER — Telehealth (HOSPITAL_BASED_OUTPATIENT_CLINIC_OR_DEPARTMENT_OTHER): Payer: Self-pay | Admitting: Emergency Medicine

## 2013-08-17 NOTE — Telephone Encounter (Signed)
Post ED Visit - Positive Culture Follow-up  Culture report reviewed by antimicrobial stewardship pharmacist: []  Wes Dulaney, Pharm.D., BCPS []  Celedonio MiyamotoJeremy Frens, Pharm.D., BCPS []  Georgina PillionElizabeth Martin, [address] BCPS []  [place]Minh Pham, [address]Ph[address]., BCPS, AAHIVP []  Estella HuskMichelle Turner, Pharm.D., BCPS, AAHIVP []  Harvie JuniorNathan Cope, Pharm.D. [x]  Lysle Pearlachel Rumbarger, Pharm.D.  Positive urine culture Treated with Keflex, organism sensitive to the same and no further patient follow-up is required at this time.  Vaudine Dutan 08/17/2013, 3:08 PM

## 2013-08-26 ENCOUNTER — Emergency Department (HOSPITAL_BASED_OUTPATIENT_CLINIC_OR_DEPARTMENT_OTHER): Payer: Medicare Other

## 2013-08-26 ENCOUNTER — Encounter (HOSPITAL_BASED_OUTPATIENT_CLINIC_OR_DEPARTMENT_OTHER): Payer: Self-pay | Admitting: Emergency Medicine

## 2013-08-26 ENCOUNTER — Inpatient Hospital Stay (HOSPITAL_BASED_OUTPATIENT_CLINIC_OR_DEPARTMENT_OTHER)
Admission: EM | Admit: 2013-08-26 | Discharge: 2013-08-28 | DRG: 562 | Disposition: A | Discharge status: Skilled Nursing Facility | Payer: Medicare Other | Attending: Internal Medicine | Admitting: Internal Medicine

## 2013-08-26 DIAGNOSIS — E042 Nontoxic multinodular goiter: Secondary | ICD-10-CM

## 2013-08-26 DIAGNOSIS — R1310 Dysphagia, unspecified: Secondary | ICD-10-CM | POA: Diagnosis present

## 2013-08-26 DIAGNOSIS — M25559 Pain in unspecified hip: Secondary | ICD-10-CM

## 2013-08-26 DIAGNOSIS — F0390 Unspecified dementia without behavioral disturbance: Secondary | ICD-10-CM | POA: Diagnosis present

## 2013-08-26 DIAGNOSIS — Y9210 Unspecified residential institution as the place of occurrence of the external cause: Secondary | ICD-10-CM | POA: Diagnosis present

## 2013-08-26 DIAGNOSIS — Z66 Do not resuscitate: Secondary | ICD-10-CM | POA: Diagnosis present

## 2013-08-26 DIAGNOSIS — S92919B Unspecified fracture of unspecified toe(s), initial encounter for open fracture: Principal | ICD-10-CM | POA: Diagnosis present

## 2013-08-26 DIAGNOSIS — S92911B Unspecified fracture of right toe(s), initial encounter for open fracture: Secondary | ICD-10-CM

## 2013-08-26 DIAGNOSIS — E539 Vitamin B deficiency, unspecified: Secondary | ICD-10-CM | POA: Diagnosis present

## 2013-08-26 DIAGNOSIS — S99929A Unspecified injury of unspecified foot, initial encounter: Secondary | ICD-10-CM | POA: Diagnosis present

## 2013-08-26 DIAGNOSIS — N390 Urinary tract infection, site not specified: Secondary | ICD-10-CM | POA: Diagnosis present

## 2013-08-26 DIAGNOSIS — Z888 Allergy status to other drugs, medicaments and biological substances status: Secondary | ICD-10-CM

## 2013-08-26 DIAGNOSIS — G9340 Encephalopathy, unspecified: Secondary | ICD-10-CM | POA: Diagnosis present

## 2013-08-26 DIAGNOSIS — E870 Hyperosmolality and hypernatremia: Secondary | ICD-10-CM | POA: Diagnosis present

## 2013-08-26 DIAGNOSIS — T17908A Unspecified foreign body in respiratory tract, part unspecified causing other injury, initial encounter: Secondary | ICD-10-CM

## 2013-08-26 DIAGNOSIS — F39 Unspecified mood [affective] disorder: Secondary | ICD-10-CM | POA: Diagnosis present

## 2013-08-26 DIAGNOSIS — I10 Essential (primary) hypertension: Secondary | ICD-10-CM | POA: Diagnosis present

## 2013-08-26 DIAGNOSIS — W050XXA Fall from non-moving wheelchair, initial encounter: Secondary | ICD-10-CM | POA: Diagnosis present

## 2013-08-26 LAB — COMPREHENSIVE METABOLIC PANEL
ALBUMIN: 3.4 g/dL — AB (ref 3.5–5.2)
ALT: 19 U/L (ref 0–35)
AST: 27 U/L (ref 0–37)
Alkaline Phosphatase: 196 U/L — ABNORMAL HIGH (ref 39–117)
BUN: 41 mg/dL — ABNORMAL HIGH (ref 6–23)
CALCIUM: 9.9 mg/dL (ref 8.4–10.5)
CO2: 25 meq/L (ref 19–32)
CREATININE: 1.1 mg/dL (ref 0.50–1.10)
Chloride: 109 mEq/L (ref 96–112)
GFR calc Af Amer: 50 mL/min — ABNORMAL LOW (ref >90)
GFR, EST NON AFRICAN AMERICAN: 44 mL/min — AB (ref >90)
Glucose, Bld: 104 mg/dL — ABNORMAL HIGH (ref 70–99)
Potassium: 4.5 mEq/L (ref 3.7–5.3)
SODIUM: 149 meq/L — AB (ref 137–147)
Total Bilirubin: 0.2 mg/dL — ABNORMAL LOW (ref 0.3–1.2)
Total Protein: 7.5 g/dL (ref 6.0–8.3)

## 2013-08-26 LAB — URINALYSIS, ROUTINE W REFLEX MICROSCOPIC
Bilirubin Urine: NEGATIVE
GLUCOSE, UA: NEGATIVE mg/dL
Hgb urine dipstick: NEGATIVE
KETONES UR: NEGATIVE mg/dL
NITRITE: NEGATIVE
PH: 5 (ref 5.0–8.0)
Protein, ur: 30 mg/dL — AB
SPECIFIC GRAVITY, URINE: 1.02 (ref 1.005–1.030)
Urobilinogen, UA: 0.2 mg/dL (ref 0.0–1.0)

## 2013-08-26 LAB — URINE MICROSCOPIC-ADD ON

## 2013-08-26 LAB — CREATININE, SERUM
Creatinine, Ser: 1 mg/dL (ref 0.50–1.10)
GFR calc Af Amer: 57 mL/min — ABNORMAL LOW (ref >90)
GFR, EST NON AFRICAN AMERICAN: 49 mL/min — AB (ref >90)

## 2013-08-26 LAB — CBC
HEMATOCRIT: 31.7 % — AB (ref 36.0–46.0)
HEMOGLOBIN: 10.3 g/dL — AB (ref 12.0–15.0)
MCH: 31.3 pg (ref 26.0–34.0)
MCHC: 32.5 g/dL (ref 30.0–36.0)
MCV: 96.4 fL (ref 78.0–100.0)
Platelets: 187 10*3/uL (ref 150–400)
RBC: 3.29 MIL/uL — ABNORMAL LOW (ref 3.87–5.11)
RDW: 14.1 % (ref 11.5–15.5)
WBC: 7.9 10*3/uL (ref 4.0–10.5)

## 2013-08-26 LAB — CBC WITH DIFFERENTIAL/PLATELET
BASOS ABS: 0 10*3/uL (ref 0.0–0.1)
BASOS PCT: 0 % (ref 0–1)
Eosinophils Absolute: 0.2 10*3/uL (ref 0.0–0.7)
Eosinophils Relative: 2 % (ref 0–5)
HCT: 34 % — ABNORMAL LOW (ref 36.0–46.0)
Hemoglobin: 11 g/dL — ABNORMAL LOW (ref 12.0–15.0)
LYMPHS PCT: 15 % (ref 12–46)
Lymphs Abs: 1.6 10*3/uL (ref 0.7–4.0)
MCH: 32.3 pg (ref 26.0–34.0)
MCHC: 32.4 g/dL (ref 30.0–36.0)
MCV: 99.7 fL (ref 78.0–100.0)
Monocytes Absolute: 0.8 10*3/uL (ref 0.1–1.0)
Monocytes Relative: 7 % (ref 3–12)
NEUTROS ABS: 7.9 10*3/uL — AB (ref 1.7–7.7)
Neutrophils Relative %: 76 % (ref 43–77)
PLATELETS: 204 10*3/uL (ref 150–400)
RBC: 3.41 MIL/uL — ABNORMAL LOW (ref 3.87–5.11)
RDW: 14 % (ref 11.5–15.5)
WBC: 10.5 10*3/uL (ref 4.0–10.5)

## 2013-08-26 LAB — PROTIME-INR
INR: 1.07 (ref 0.00–1.49)
Prothrombin Time: 13.7 seconds (ref 11.6–15.2)

## 2013-08-26 MED ORDER — SODIUM CHLORIDE 0.9 % IV SOLN
INTRAVENOUS | Status: DC
  Administered 2013-08-26: via INTRAVENOUS

## 2013-08-26 MED ORDER — MEMANTINE HCL 5 MG PO TABS
5.0000 mg | ORAL_TABLET | Freq: Two times a day (BID) | ORAL | Status: DC
  Administered 2013-08-27 – 2013-08-28 (×3): 5 mg via ORAL
  Filled 2013-08-26 (×5): qty 1

## 2013-08-26 MED ORDER — OXCARBAZEPINE 300 MG PO TABS
300.0000 mg | ORAL_TABLET | Freq: Two times a day (BID) | ORAL | Status: DC
  Administered 2013-08-27 – 2013-08-28 (×3): 300 mg via ORAL
  Filled 2013-08-26 (×5): qty 1

## 2013-08-26 MED ORDER — MEMANTINE HCL ER 14 MG PO CP24: 14.0000 mg | ORAL_CAPSULE | Freq: Every day | ORAL | Status: DC

## 2013-08-26 MED ORDER — FERROUS SULFATE 325 (65 FE) MG PO TABS
325.0000 mg | ORAL_TABLET | Freq: Every day | ORAL | Status: DC
  Administered 2013-08-27 – 2013-08-28 (×2): 325 mg via ORAL
  Filled 2013-08-26 (×3): qty 1

## 2013-08-26 MED ORDER — DIMETHICONE 1 % EX CREA: TOPICAL_CREAM | Freq: Every day | CUTANEOUS | Status: DC | PRN

## 2013-08-26 MED ORDER — CEFAZOLIN SODIUM 1-5 GM-% IV SOLN
1.0000 g | Freq: Once | INTRAVENOUS | Status: AC
Start: 2013-08-26 — End: 2013-08-26
  Administered 2013-08-26: 1 g via INTRAVENOUS
  Filled 2013-08-26: qty 50

## 2013-08-26 MED ORDER — DEXTROSE 5 % IV SOLN: 1.0000 g | Freq: Once | INTRAVENOUS | Status: DC

## 2013-08-26 MED ORDER — LORAZEPAM 2 MG/ML IJ SOLN
INTRAMUSCULAR | Status: AC
  Administered 2013-08-26: 14:00:00
  Filled 2013-08-26: qty 1

## 2013-08-26 MED ORDER — ADULT MULTIVITAMIN W/MINERALS CH
1.0000 | ORAL_TABLET | Freq: Every day | ORAL | Status: DC
  Administered 2013-08-27 – 2013-08-28 (×2): 1 via ORAL
  Filled 2013-08-26 (×2): qty 1

## 2013-08-26 MED ORDER — VITAMIN C 500 MG PO TABS
500.0000 mg | ORAL_TABLET | Freq: Every day | ORAL | Status: DC
  Administered 2013-08-27 – 2013-08-28 (×2): 500 mg via ORAL
  Filled 2013-08-26 (×2): qty 1

## 2013-08-26 MED ORDER — PANTOPRAZOLE SODIUM 40 MG PO TBEC: 40.0000 mg | DELAYED_RELEASE_TABLET | Freq: Every day | ORAL | Status: DC

## 2013-08-26 MED ORDER — VITAMIN B-12 1000 MCG PO TABS
1000.0000 ug | ORAL_TABLET | Freq: Every day | ORAL | Status: DC
  Administered 2013-08-27 – 2013-08-28 (×2): 1000 ug via ORAL
  Filled 2013-08-26 (×2): qty 1

## 2013-08-26 MED ORDER — DONEPEZIL HCL 10 MG PO TABS
10.0000 mg | ORAL_TABLET | Freq: Every day | ORAL | Status: DC
  Administered 2013-08-27: 10 mg via ORAL
  Filled 2013-08-26 (×3): qty 1

## 2013-08-26 MED ORDER — HYDROCODONE-ACETAMINOPHEN 5-325 MG PO TABS
1.0000 | ORAL_TABLET | ORAL | Status: DC | PRN
  Administered 2013-08-27 – 2013-08-28 (×3): 1 via ORAL
  Filled 2013-08-26 (×3): qty 1

## 2013-08-26 MED ORDER — SODIUM CHLORIDE 0.9 % IV BOLUS (SEPSIS)
1000.0000 mL | Freq: Once | INTRAVENOUS | Status: AC
  Administered 2013-08-26: 500 mL via INTRAVENOUS

## 2013-08-26 MED ORDER — VITAMIN D3 25 MCG (1000 UNIT) PO TABS
1000.0000 [IU] | ORAL_TABLET | Freq: Every day | ORAL | Status: DC
  Administered 2013-08-27 – 2013-08-28 (×2): 1000 [IU] via ORAL
  Filled 2013-08-26 (×2): qty 1

## 2013-08-26 MED ORDER — TETANUS-DIPHTH-ACELL PERTUSSIS 5-2.5-18.5 LF-MCG/0.5 IM SUSP
0.5000 mL | Freq: Once | INTRAMUSCULAR | Status: AC
  Administered 2013-08-26: 0.5 mL via INTRAMUSCULAR
  Filled 2013-08-26: qty 0.5

## 2013-08-26 MED ORDER — LISINOPRIL 2.5 MG PO TABS
2.5000 mg | ORAL_TABLET | Freq: Every day | ORAL | Status: DC
  Administered 2013-08-27 – 2013-08-28 (×2): 2.5 mg via ORAL
  Filled 2013-08-26 (×2): qty 1

## 2013-08-26 MED ORDER — DEXTROSE 5 % IV SOLN
1.0000 g | INTRAVENOUS | Status: DC
  Administered 2013-08-27: 1 g via INTRAVENOUS
  Filled 2013-08-26 (×2): qty 10

## 2013-08-26 MED ORDER — HEPARIN SODIUM (PORCINE) 5000 UNIT/ML IJ SOLN
5000.0000 [IU] | Freq: Three times a day (TID) | INTRAMUSCULAR | Status: DC
  Administered 2013-08-27 – 2013-08-28 (×4): 5000 [IU] via SUBCUTANEOUS
  Filled 2013-08-26 (×7): qty 1

## 2013-08-26 MED ORDER — BAZA PROTECT EX CREA: TOPICAL_CREAM | Freq: Every day | CUTANEOUS | Status: DC | PRN

## 2013-08-26 NOTE — ED Notes (Signed)
Called [facility] for Orthopedic--via Sam at [facility]

## 2013-08-26 NOTE — ED Notes (Signed)
Margaret Mcgrath= [phone] Daughter. Call if you have any questions.

## 2013-08-26 NOTE — H&P (Addendum)
Hospitalist Admission History and Physical  Patient name: Margaret Mcgrath Medical record number: [id number] Date of birth: 1924-02-16 Age: 80 y.o. Gender: female  Primary Care Provider: Thane Edu, MD  Chief Complaint: foot injury, encephalopathy History of Present Illness:This is a 80 y.o. year old female with prior hx/o end stage dementia, HTN presenting with foot injury and encephalopathy. Pt lives in local SNF. Per report, pt accidentally fell out of wheelchair injuring R 5th toe. Pt presented to ER today because of toe injury. Nopted 5th middle phalanx fracture. There has also been report of worsening confusion and decreased appetite over past week. Pt had head trauma last week with some superficial lacerations s/p suture placement. No nausea. + mild decreased appetite. Was also being treated for UTI at nursing home.  On presentation, afebrile. SBPs in 90s-200s. WBC 10.5. Hgb 11. Na 149. UA indicative of infection. Imaging including CXR, hip films, head CT negative for any acute abnormalities. Ortho consults with preliminary recs for wound wash out and stablization. Ortho recs pending.   Patient Active Problem List   Diagnosis Date Noted  . Foot injury 08/26/2013  . Multinodular goiter (nontoxic) 12/07/2010   Past Medical History: Past Medical History  Diagnosis Date  . Depressed   . Hypertension   . Dementia     with psychosis  . Insomnia   . Anemia   . Mood disorder   . Uterine prolapse   . Vaginal prolapse   . Cystocele   . Incontinence   . Dysphagia     pureed diet  . Prediabetes   . Tinea cruris   . Weight loss     03/2011  . Vitamin B deficiency   . Right thyroid nodule   . Pneumonia     10/2010  . Dementia   . HTN (hypertension)   . Depression     Past Surgical History: History reviewed. No pertinent past surgical history.  Social History: History   Social History  . Marital Status: Unknown    Spouse Name: N/A    Number of Children: N/A  .  Years of Education: N/A   Social History Main Topics  . Smoking status: Unknown If Ever Smoked  . Smokeless tobacco: None  . Alcohol Use: No  . Drug Use: No  . Sexual Activity: No   Other Topics Concern  . None   Social History Narrative   ** Merged History Encounter **       ** Merged History Encounter **        Family History: No family history on file.  Allergies: Allergies  Allergen Reactions  . Chlorethamine [Ethylenediamine]     Unknown reaction  . Thorazine [Chlorpromazine Hcl] Other (See Comments)    Per MAR    Current Facility-Administered Medications  Medication Dose Route Frequency Provider Last Rate Last Dose  . 0.9 %  sodium chloride infusion   Intravenous Continuous Doree Albee, MD      . cefTRIAXone (ROCEPHIN) 1 g in dextrose 5 % 50 mL IVPB  1 g Intravenous Q24H Doree Albee, MD      . cholecalciferol (VITAMIN D) tablet 1,000 Units  1,000 Units Oral Daily Doree Albee, MD      . dimethicone-zinc oxide cream   Topical Daily PRN Doree Albee, MD      . donepezil (ARICEPT) tablet 10 mg  10 mg Oral QHS Doree Albee, MD      . Melene Muller ON 08/27/2013] ferrous sulfate tablet 325 mg  325  mg Oral Q breakfast Doree Albee, MD      . heparin injection 5,000 Units  5,000 Units Subcutaneous 3 times per day Doree Albee, MD      . HYDROcodone-acetaminophen (NORCO/VICODIN) 5-325 MG per tablet 1 tablet  1 tablet Oral Q4H PRN Doree Albee, MD      . lisinopril (PRINIVIL,ZESTRIL) tablet 2.5 mg  2.5 mg Oral Daily Doree Albee, MD      . Memantine HCl ER CP24 14 mg  14 mg Oral QHS Doree Albee, MD      . multivitamin with minerals tablet 1 tablet  1 tablet Oral Daily Doree Albee, MD      . Oxcarbazepine (TRILEPTAL) tablet 300 mg  300 mg Oral BID Doree Albee, MD      . pantoprazole (PROTONIX) EC tablet 40 mg  40 mg Oral Daily Doree Albee, MD      . vitamin B-12 (CYANOCOBALAMIN) tablet 1,000 mcg  1,000 mcg Oral Daily Doree Albee, MD      . vitamin C (ASCORBIC  ACID) tablet 500 mg  500 mg Oral Daily Doree Albee, MD       Current Outpatient Prescriptions  Medication Sig Dispense Refill  . cetirizine (ZYRTEC) 10 MG tablet Take 10 mg by mouth at bedtime.      . cholecalciferol (VITAMIN D) 1000 UNITS tablet Take 1,000 Units by mouth daily.        Marland Kitchen donepezil (ARICEPT) 10 MG tablet Take 10 mg by mouth at bedtime.      . ferrous sulfate 325 (65 FE) MG tablet Take 325 mg by mouth daily with breakfast.      . HYDROcodone-acetaminophen (NORCO/VICODIN) 5-325 MG per tablet Take 1 tablet by mouth every 4 (four) hours as needed (pain).      Marland Kitchen lisinopril (PRINIVIL,ZESTRIL) 2.5 MG tablet Take 2.5 mg by mouth daily.      . Memantine HCl ER (NAMENDA XR) 14 MG CP24 Take 14 mg by mouth at bedtime.       . Multiple Vitamin (MULTIVITAMIN WITH MINERALS) TABS tablet Take 1 tablet by mouth daily.      Marland Kitchen omeprazole (PRILOSEC) 20 MG capsule Take 20 mg by mouth daily.      . Oxcarbazepine (TRILEPTAL) 300 MG tablet Take 300 mg by mouth 2 (two) times daily.      . Skin Protectants, Misc. (BAZA PROTECT EX) Apply 1 application topically See admin instructions. Apply to buttocks three times daily and as needed after each incontinent episode      . vitamin B-12 (CYANOCOBALAMIN) 1000 MCG tablet Take 1,000 mcg by mouth daily.      . vitamin C (ASCORBIC ACID) 500 MG tablet Take 500 mg by mouth daily.      . cephALEXin (KEFLEX) 500 MG capsule Take 1 capsule (500 mg total) by mouth 3 (three) times daily.  21 capsule  0   Review Of Systems: 12 point ROS negative except as noted above in HPI.  Physical Exam: Filed Vitals:   08/26/13 2018  BP:   Pulse:   Temp: 97 F (36.1 C)  Resp:     General: non verbal, cachectic  HEENT: PERRLA and extra ocular movement intact Heart: S1, S2 normal, no murmur, rub or gallop, regular rate and rhythm Lungs: clear to auscultation, no wheezes or rales and unlabored breathing Abdomen: + bowel sounds, + mild abd TTP  Extremities: 2+ peripheral  pulses, + open laceration on dorsum of 5th digit  Skin:no rashes Neurology: minimally cooperative  to exam, no major focal deficits noted   Labs and Imaging: Lab Results  Component Value Date/Time   NA 149* 08/26/2013  1:45 PM   K 4.5 08/26/2013  1:45 PM   CL 109 08/26/2013  1:45 PM   CO2 25 08/26/2013  1:45 PM   BUN 41* 08/26/2013  1:45 PM   CREATININE 1.10 08/26/2013  1:45 PM   GLUCOSE 104* 08/26/2013  1:45 PM   Lab Results  Component Value Date   WBC 10.5 08/26/2013   HGB 11.0* 08/26/2013   HCT 34.0* 08/26/2013   MCV 99.7 08/26/2013   PLT 204 08/26/2013   Urinalysis    Component Value Date/Time   COLORURINE YELLOW 08/26/2013 1400   APPEARANCEUR CLEAR 08/26/2013 1400   LABSPEC 1.020 08/26/2013 1400   PHURINE 5.0 08/26/2013 1400   GLUCOSEU NEGATIVE 08/26/2013 1400   HGBUR NEGATIVE 08/26/2013 1400   BILIRUBINUR NEGATIVE 08/26/2013 1400   KETONESUR NEGATIVE 08/26/2013 1400   PROTEINUR 30* 08/26/2013 1400   UROBILINOGEN 0.2 08/26/2013 1400   NITRITE NEGATIVE 08/26/2013 1400   LEUKOCYTESUR MODERATE* 08/26/2013 1400       Dg Chest 1 View  08/26/2013   CLINICAL DATA:  foot injury, laceration  EXAM: CHEST - 1 VIEW  COMPARISON:  04/27/2013  FINDINGS: Relatively low lung volumes. Somewhat coarse interstitial markings but no confluent infiltrate or overt edema. Mild cardiomegaly. Tortuous atheromatous thoracic aorta. . Blunting of the left lateral costophrenic angle. Old bilateral rib fractures.  Surgical clips right upper abdomen.  IMPRESSION: 1. Stable cardiomegaly and chronic changes.  No acute disease.   Electronically Signed   By: Oley Balm M.D.   On: 08/26/2013 14:40   Dg Hip Complete Right  08/26/2013   CLINICAL DATA:  Injury.  Right hip pain  EXAM: RIGHT HIP - COMPLETE 2+ VIEW  COMPARISON:  08/15/2013  FINDINGS: There is no evidence of hip fracture or dislocation. There is no evidence of arthropathy or other focal bone abnormality.  IMPRESSION: Negative.   Electronically Signed   By:  Marlan Palau M.D.   On: 08/26/2013 16:11   Ct Head Wo Contrast  08/26/2013   CLINICAL DATA:  ams LACERATION  EXAM: CT HEAD WITHOUT CONTRAST  TECHNIQUE: Contiguous axial images were obtained from the base of the skull through the vertex without intravenous contrast.  COMPARISON:  08/14/2013  FINDINGS: Atherosclerotic and physiologic intracranial calcifications. Patient motion degrades some of the images despite repeating. 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 Diffuse parenchymal atrophy. Patchy areas of hypoattenuation in deep and periventricular white matter bilaterally. Negative for acute intracranial hemorrhage, mass lesion, acute infarction, midline shift, or mass-effect. Acute infarct may be inapparent on noncontrast CT. Ventricles and sulci symmetric. Bone windows demonstrate no focal lesion.  IMPRESSION: 1. Negative for bleed or other acute intracranial process.   Electronically Signed   By: Oley Balm M.D.   On: 08/26/2013 14:38   Dg Foot Complete Right  08/26/2013   CLINICAL DATA:  Traumatic injury and pain  EXAM: RIGHT FOOT COMPLETE - 3+ VIEW  COMPARISON:  None.  FINDINGS: Mild osteopenia is noted. There is apparent discontinuity in the fifth middle phalanx. This may be related to fracture although the area is obscured by overlying bandages. No other fracture is seen. No gross soft tissue abnormality is noted.  IMPRESSION: Apparent fracture of the fifth middle phalanx.   Electronically Signed   By: Alcide Clever M.D.   On: 08/26/2013 13:10     Assessment and Plan: Simonetta Patience  Romeo AppleHarrison is a 80 y.o. year old female presenting with encephalopathy, foot injury   Foot injury: per ortho. Recs pending. Minimal bleeding.   Encephalopathy: likely multifactorial with contributions of dehydration, UTI, dementia. Gently hydrate pt. IV rocephin (may consolidate abx pending ortho recs). Continue to follow. Continue dementia meds.   HTN: labile BP. Continue home regimen. Titrate as clinically indicated.    Neuro: On trileptal. Unclear if prior hx/o seizure d/o. Will continue   FEN/GI: NPO. Pending bedside swallow eval. Pureed diet in nurinsg home. PPI. Noted hypernatremia in setting of dehydration. Hydrate and reassess.   Prophylaxis: sub q heparin  Disposition: pending further evaluation  Code Status:DNR        Doree AlbeeSteven Britta Louth MD  Pager: [phone]

## 2013-08-26 NOTE — ED Notes (Signed)
Pt anxious- new orders received.

## 2013-08-26 NOTE — ED Provider Notes (Signed)
CSN: [id number]     Arrival date & time 08/26/13  1243 History  This chart was scribed for Glynn Octave, MD by Leone Payor, ED Scribe. This patient was seen in room MH08/MH08 and the patient's care was started 1:31 PM.    Chief Complaint  Patient presents with  . Foot Injury  . Laceration      The history is provided by the patient. The history is limited by the condition of the patient. No language interpreter was used.   LEVEL 5 CAVEAT-DEMENTIA HPI Comments: Margaret Mcgrath is a 80 y.o. female who presents to the Emergency Department from a nursing home complaining of a right 5th digit laceration that occurred PTA. Patient is an unreliable historian. Per EMS, patient's right foot was caught in the wheelchair.  Patient is unable to give a history secondary to her dementia. She is oriented times self only.  Past Medical History  Diagnosis Date  . Depressed   . Hypertension   . Dementia     with psychosis  . Insomnia   . Anemia   . Mood disorder   . Uterine prolapse   . Vaginal prolapse   . Cystocele   . Incontinence   . Dysphagia     pureed diet  . Prediabetes   . Tinea cruris   . Weight loss     03/2011  . Vitamin B deficiency   . Right thyroid nodule   . Pneumonia     10/2010  . Dementia   . HTN (hypertension)   . Depression    History reviewed. No pertinent past surgical history. No family history on file. History  Substance Use Topics  . Smoking status: Unknown If Ever Smoked  . Smokeless tobacco: Not on file  . Alcohol Use: No   OB History   Grav Para Term Preterm Abortions TAB SAB Ect Mult Living                 Review of Systems  Unable to perform ROS   Allergies  Chlorethamine; Chlorpromazine; and Thorazine  Home Medications   Prior to Admission medications   Medication Sig Start Date End Date Taking? Authorizing Provider  cephALEXin (KEFLEX) 500 MG capsule Take 1 capsule (500 mg total) by mouth 3 (three) times daily. 08/14/13   Charles  B. Bernette Mayers, MD  cetirizine (ZYRTEC) 10 MG tablet Take 10 mg by mouth at bedtime.    Historical Provider, MD  cetirizine (ZYRTEC) 10 MG tablet Take 10 mg by mouth daily.    Historical Provider, MD  cholecalciferol (VITAMIN D) 1000 UNITS tablet Take 1,000 Units by mouth daily.      Historical Provider, MD  donepezil (ARICEPT) 10 MG tablet Take 10 mg by mouth at bedtime as needed.      Historical Provider, MD  donepezil (ARICEPT) 10 MG tablet Take 10 mg by mouth at bedtime as needed.    Historical Provider, MD  donepezil (ARICEPT) 10 MG tablet Take 10 mg by mouth at bedtime.    Historical Provider, MD  doxycycline (VIBRAMYCIN) 100 MG capsule Take 1 capsule (100 mg total) by mouth 2 (two) times daily. 10/18/12   Gilda Crease, MD  ENSURE (ENSURE) Take 237 mLs by mouth.      Historical Provider, MD  ferrous sulfate 325 (65 FE) MG tablet Take 325 mg by mouth daily with breakfast.    Historical Provider, MD  lisinopril (PRINIVIL,ZESTRIL) 2.5 MG tablet Take 2.5 mg by mouth daily.  Historical Provider, MD  lisinopril (PRINIVIL,ZESTRIL) 2.5 MG tablet Take 2.5 mg by mouth daily.    Historical Provider, MD  memantine (NAMENDA) 10 MG tablet Take 10 mg by mouth 2 (two) times daily.    Historical Provider, MD  Memantine HCl ER (NAMENDA XR) 14 MG CP24 Take 1 tablet by mouth daily.    Historical Provider, MD  Multiple Vitamin (MULTIVITAMIN) capsule Take 1 capsule by mouth daily.    Historical Provider, MD  omeprazole (PRILOSEC) 20 MG capsule Take 20 mg by mouth daily.      Historical Provider, MD  omeprazole (PRILOSEC) 20 MG capsule Take 20 mg by mouth daily.    Historical Provider, MD  Oxcarbazepine (TRILEPTAL) 300 MG tablet Take 300 mg by mouth 2 (two) times daily.    Historical Provider, MD  vitamin C (ASCORBIC ACID) 500 MG tablet Take 500 mg by mouth daily.      Historical Provider, MD  vitamin C (ASCORBIC ACID) 500 MG tablet Take 500 mg by mouth daily.    Historical Provider, MD   BP 127/58   Pulse 73  Temp(Src) 98.3 F (36.8 C) (Rectal)  Resp 22  SpO2 98% Physical Exam  Nursing note and vitals reviewed. Constitutional: She is oriented to person, place, and time. She appears well-developed and well-nourished.  HENT:  Head: Normocephalic.  Mouth/Throat: Mucous membranes are dry.  Old appearing ecchymosis to right cheek and nose. White coating on tongue   Eyes: Conjunctivae and EOM are normal. Pupils are equal, round, and reactive to light.  Neck: Normal range of motion.  No cervical spine tenderness.   Cardiovascular: Normal rate and regular rhythm.   Murmur heard.  Systolic murmur is present  Holosystolic murmer.  DP and PT pulses intact.   Pulmonary/Chest: Effort normal. No respiratory distress.  Abdominal: Soft. She exhibits no distension. There is no tenderness.  Musculoskeletal: Normal range of motion. She exhibits tenderness.  Right foot: Right 5th digit has near amputation through middle phalanx.   Full ROM of hips without pain.  No T or L spine tendernes  Ecchymosis and bruising to R lateral hip  Neurological: She is alert and oriented to person, place, and time.  Skin: Skin is warm and dry.  Psychiatric: She has a normal mood and affect.    ED Course  Procedures (including critical care time)  DIAGNOSTIC STUDIES: Oxygen Saturation is 98% on RA, normal by my interpretation.    COORDINATION OF CARE: 1:45 PM Per nursing staff here, patient was talkative and able to carry conversation when she was last seen on 08/14/13. She is unable to do so currently.    Labs Review Labs Reviewed  CBC WITH DIFFERENTIAL - Abnormal; Notable for the following:    RBC 3.41 (*)    Hemoglobin 11.0 (*)    HCT 34.0 (*)    Neutro Abs 7.9 (*)    All other components within normal limits  COMPREHENSIVE METABOLIC PANEL - Abnormal; Notable for the following:    Sodium 149 (*)    Glucose, Bld 104 (*)    BUN 41 (*)    Albumin 3.4 (*)    Alkaline Phosphatase 196 (*)    Total  Bilirubin 0.2 (*)    GFR calc non Af Amer 44 (*)    GFR calc Af Amer 50 (*)    All other components within normal limits  URINALYSIS, ROUTINE W REFLEX MICROSCOPIC - Abnormal; Notable for the following:    Protein, ur 30 (*)  Leukocytes, UA MODERATE (*)    All other components within normal limits  URINE MICROSCOPIC-ADD ON - Abnormal; Notable for the following:    Bacteria, UA MANY (*)    All other components within normal limits  PROTIME-INR    Imaging Review Dg Chest 1 View  08/26/2013   CLINICAL DATA:  foot injury, laceration  EXAM: CHEST - 1 VIEW  COMPARISON:  04/27/2013  FINDINGS: Relatively low lung volumes. Somewhat coarse interstitial markings but no confluent infiltrate or overt edema. Mild cardiomegaly. Tortuous atheromatous thoracic aorta. . Blunting of the left lateral costophrenic angle. Old bilateral rib fractures.  Surgical clips right upper abdomen.  IMPRESSION: 1. Stable cardiomegaly and chronic changes.  No acute disease.   Electronically Signed   By: Oley Balmaniel  Hassell M.D.   On: 08/26/2013 14:40   Dg Hip Complete Right  08/26/2013   CLINICAL DATA:  Injury.  Right hip pain  EXAM: RIGHT HIP - COMPLETE 2+ VIEW  COMPARISON:  08/15/2013  FINDINGS: There is no evidence of hip fracture or dislocation. There is no evidence of arthropathy or other focal bone abnormality.  IMPRESSION: Negative.   Electronically Signed   By: Marlan Palauharles  Clark M.D.   On: 08/26/2013 16:11   Ct Head Wo Contrast  08/26/2013   CLINICAL DATA:  ams LACERATION  EXAM: CT HEAD WITHOUT CONTRAST  TECHNIQUE: Contiguous axial images were obtained from the base of the skull through the vertex without intravenous contrast.  COMPARISON:  08/14/2013  FINDINGS: Atherosclerotic and physiologic intracranial calcifications. Patient motion degrades some of the images despite repeating. 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 Diffuse parenchymal atrophy. Patchy areas of hypoattenuation in deep and periventricular white matter  bilaterally. Negative for acute intracranial hemorrhage, mass lesion, acute infarction, midline shift, or mass-effect. Acute infarct may be inapparent on noncontrast CT. Ventricles and sulci symmetric. Bone windows demonstrate no focal lesion.  IMPRESSION: 1. Negative for bleed or other acute intracranial process.   Electronically Signed   By: Oley Balmaniel  Hassell M.D.   On: 08/26/2013 14:38   Dg Foot Complete Right  08/26/2013   CLINICAL DATA:  Traumatic injury and pain  EXAM: RIGHT FOOT COMPLETE - 3+ VIEW  COMPARISON:  None.  FINDINGS: Mild osteopenia is noted. There is apparent discontinuity in the fifth middle phalanx. This may be related to fracture although the area is obscured by overlying bandages. No other fracture is seen. No gross soft tissue abnormality is noted.  IMPRESSION: Apparent fracture of the fifth middle phalanx.   Electronically Signed   By: Alcide CleverMark  Lukens M.D.   On: 08/26/2013 13:10     EKG Interpretation None      MDM   Final diagnoses:  Open fracture of toe of right foot  Urinary tract infection   Patient with dementia unable to give a history. Has open fracture and near amputation to right fifth toe after being run over by wheelchair.  X-ray shows fracture of the middle phalanx. Near amputation discussed with on-call orthopedic doctor Dion SaucierLandau who requests [facility] his evaluation. [facility] Dion [facility]ucierLandau [facility] but [facility].  Also spoke with Dr. Eulah PontMurphy who [facility].   Patient also noted to have UTI and mild hypernatremia of 149. She's given gentle IV hydration. Ancef already given for open fracture. Large bruise over right lateral hip. X-ray negative. This is appears to be older.   Patient will likely need a medical admission after  evaluation for toe fracture. She appears to be more confused than baseline, has UTI and hypernatremia. Orthopedics requests to evaluate toe in ED rather than medical floor.  D/w Dr.  Freida Busman.    I personally performed the services described in this documentation, which was scribed in my presence. The recorded information has been reviewed and is accurate.   Glynn Octave, MD 08/26/13 [phone]

## 2013-08-26 NOTE — ED Provider Notes (Signed)
Patient here as a transfer from med [facility]. The patient has a UTI and is not acting her normal per family. Due to this she will need IV antibiotics and treatment in the hospital. She also sustained a fifth metatarsal fracture it appears open based on the subsequent medial laceration.  Given antibiotics prior to coming here. Plan was for ortho to see. D/w Dr. Eulah Pont, who is asking for it to be loosely sutured by me and he will see in AM. Possible surgery/washout tomorrow. Nurse at bedside washed laceration out, and I placed 4 loose sutures after digital block. Given her AMS with UTI will admit to hospitalist.   NERVE BLOCK Performed by: Audree Camel Consent: Verbal consent obtained. Required items: required blood products, implants, devices, and special equipment available Time out: Immediately prior to procedure a "time out" was called to verify the correct patient, procedure, equipment, support staff and site/side marked as required.  Indication: anesthesia for laceration repair Nerve block body site: right 5th digit toe block  Preparation: Patient was prepped and draped in the usual sterile fashion. Needle gauge: 25 G Location technique: anatomical landmarks  Local anesthetic: lidocaine w/o epi 2%  Anesthetic total: 5 ml  Outcome: pain improved Patient tolerance: Patient tolerated the procedure well with no immediate complications.   LACERATION REPAIR Performed by: Audree Camel Authorized by: Audree Camel Consent: Verbal consent obtained. Risks and benefits: risks, benefits and alternatives were discussed Consent given by: patient Patient identity confirmed: provided demographic data Prepped and Draped in normal sterile fashion Wound explored  Laceration Location: right 5th toe  Laceration Length: 2cm  No Foreign Bodies seen or palpated  Anesthesia: local infiltration  Local anesthetic: lidocaine 2% without epinephrine  Anesthetic total: 5  ml  Irrigation method: syringe Amount of cleaning: standard  Skin closure: loose  Number of sutures: 4 4-0 nylon  Technique: interrupted  Patient tolerance: Patient tolerated the procedure well with no immediate complications.   Audree Camel, MD 08/26/13 [phone]

## 2013-08-26 NOTE — ED Notes (Signed)
Transferred to the ER @ [facility]---via [facility]

## 2013-08-26 NOTE — Progress Notes (Signed)
Report taken from Robin, RN in ED.  

## 2013-08-26 NOTE — Consult Note (Signed)
ORTHOPAEDIC CONSULTATION  REQUESTING PHYSICIAN: Ephraim Hamburger, MD  Chief Complaint: right fifth toe injury  HPI: Margaret Mcgrath is a 80 y.o. female who is severly demented had her R foot run over by her wheel chair yesterday.  Past Medical History  Diagnosis Date  . Depressed   . Hypertension   . Dementia     with psychosis  . Insomnia   . Anemia   . Mood disorder   . Uterine prolapse   . Vaginal prolapse   . Cystocele   . Incontinence   . Dysphagia     pureed diet  . Prediabetes   . Tinea cruris   . Weight loss     03/2011  . Vitamin B deficiency   . Right thyroid nodule   . Pneumonia     10/2010  . Dementia   . HTN (hypertension)   . Depression    History reviewed. No pertinent past surgical history. History   Social History  . Marital Status: Unknown    Spouse Name: N/A    Number of Children: N/A  . Years of Education: N/A   Social History Main Topics  . Smoking status: Unknown If Ever Smoked  . Smokeless tobacco: None  . Alcohol Use: No  . Drug Use: No  . Sexual Activity: No   Other Topics Concern  . None   Social History Narrative   ** Merged History Encounter **       ** Merged History Encounter **       No family history on file. Allergies  Allergen Reactions  . Chlorethamine [Ethylenediamine]     Unknown reaction  . Thorazine [Chlorpromazine Hcl] Other (See Comments)    Per MAR   Prior to Admission medications   Medication Sig Start Date End Date Taking? Authorizing Provider  cetirizine (ZYRTEC) 10 MG tablet Take 10 mg by mouth at bedtime.   Yes Historical Provider, MD  cholecalciferol (VITAMIN D) 1000 UNITS tablet Take 1,000 Units by mouth daily.     Yes Historical Provider, MD  donepezil (ARICEPT) 10 MG tablet Take 10 mg by mouth at bedtime.   Yes Historical Provider, MD  ferrous sulfate 325 (65 FE) MG tablet Take 325 mg by mouth daily with breakfast.   Yes Historical Provider, MD  HYDROcodone-acetaminophen  (NORCO/VICODIN) 5-325 MG per tablet Take 1 tablet by mouth every 4 (four) hours as needed (pain).   Yes Historical Provider, MD  lisinopril (PRINIVIL,ZESTRIL) 2.5 MG tablet Take 2.5 mg by mouth daily.   Yes Historical Provider, MD  Memantine HCl ER (NAMENDA XR) 14 MG CP24 Take 14 mg by mouth at bedtime.    Yes Historical Provider, MD  Multiple Vitamin (MULTIVITAMIN WITH MINERALS) TABS tablet Take 1 tablet by mouth daily.   Yes Historical Provider, MD  omeprazole (PRILOSEC) 20 MG capsule Take 20 mg by mouth daily.   Yes Historical Provider, MD  Oxcarbazepine (TRILEPTAL) 300 MG tablet Take 300 mg by mouth 2 (two) times daily.   Yes Historical Provider, MD  Skin Protectants, Misc. (BAZA PROTECT EX) Apply 1 application topically See admin instructions. Apply to buttocks three times daily and as needed after each incontinent episode   Yes Historical Provider, MD  vitamin B-12 (CYANOCOBALAMIN) 1000 MCG tablet Take 1,000 mcg by mouth daily.   Yes Historical Provider, MD  vitamin C (ASCORBIC ACID) 500 MG tablet Take 500 mg by mouth daily.   Yes Historical Provider, MD  cephALEXin (KEFLEX) 500  MG capsule Take 1 capsule (500 mg total) by mouth 3 (three) times daily. 08/14/13   Charles B. Karle Starch, MD   Dg Chest 1 View  08/26/2013   CLINICAL DATA:  foot injury, laceration  EXAM: CHEST - 1 VIEW  COMPARISON:  04/27/2013  FINDINGS: Relatively low lung volumes. Somewhat coarse interstitial markings but no confluent infiltrate or overt edema. Mild cardiomegaly. Tortuous atheromatous thoracic aorta. . Blunting of the left lateral costophrenic angle. Old bilateral rib fractures.  Surgical clips right upper abdomen.  IMPRESSION: 1. Stable cardiomegaly and chronic changes.  No acute disease.   Electronically Signed   By: Arne Cleveland M.D.   On: 08/26/2013 14:40   Dg Hip Complete Right  08/26/2013   CLINICAL DATA:  Injury.  Right hip pain  EXAM: RIGHT HIP - COMPLETE 2+ VIEW  COMPARISON:  08/15/2013  FINDINGS: There is no  evidence of hip fracture or dislocation. There is no evidence of arthropathy or other focal bone abnormality.  IMPRESSION: Negative.   Electronically Signed   By: Franchot Gallo M.D.   On: 08/26/2013 16:11   Ct Head Wo Contrast  08/26/2013   CLINICAL DATA:  ams LACERATION  EXAM: CT HEAD WITHOUT CONTRAST  TECHNIQUE: Contiguous axial images were obtained from the base of the skull through the vertex without intravenous contrast.  COMPARISON:  08/14/2013  FINDINGS: Atherosclerotic and physiologic intracranial calcifications. Patient motion degrades some of the images despite repeating. 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 Diffuse parenchymal atrophy. Patchy areas of hypoattenuation in deep and periventricular white matter bilaterally. Negative for acute intracranial hemorrhage, mass lesion, acute infarction, midline shift, or mass-effect. Acute infarct may be inapparent on noncontrast CT. Ventricles and sulci symmetric. Bone windows demonstrate no focal lesion.  IMPRESSION: 1. Negative for bleed or other acute intracranial process.   Electronically Signed   By: Arne Cleveland M.D.   On: 08/26/2013 14:38   Dg Foot Complete Right  08/26/2013   CLINICAL DATA:  Traumatic injury and pain  EXAM: RIGHT FOOT COMPLETE - 3+ VIEW  COMPARISON:  None.  FINDINGS: Mild osteopenia is noted. There is apparent discontinuity in the fifth middle phalanx. This may be related to fracture although the area is obscured by overlying bandages. No other fracture is seen. No gross soft tissue abnormality is noted.  IMPRESSION: Apparent fracture of the fifth middle phalanx.   Electronically Signed   By: Inez Catalina M.D.   On: 08/26/2013 13:10    Positive ROS: All other systems have been reviewed and were otherwise negative with the exception of those mentioned in the HPI and as above.  Labs cbc  Recent Labs  08/26/13 1345  WBC 10.5  HGB 11.0*  HCT 34.0*  PLT 204    Labs inflam No results found for this basename: ESR, CRP,   in the last 72 hours  Labs coag  Recent Labs  08/26/13 1345  INR 1.07     Recent Labs  08/26/13 1345  NA 149*  K 4.5  CL 109  CO2 25  GLUCOSE 104*  BUN 41*  CREATININE 1.10  CALCIUM 9.9    Physical Exam: Filed Vitals:   08/26/13 2018  BP:   Pulse:   Temp: 97 F (36.1 C)  Resp:    General: demented, no acute distress Cardiovascular: No pedal edema Respiratory: No cyanosis, no use of accessory musculature GI: No organomegaly, abdomen is soft and non-tender Neurologic: limited by dementia Lymphatic: No axillary or cervical lymphadenopathy  MUSCULOSKELETAL:  RLE:  closed wound to the webspace of the 5th toe, no drainage, no erythema, toe is WWP, mental status limits Neuor/motor exam. Other extremities are atraumatic with painless ROM and NVI.  Assessment: R  5th toe phalanx fracture and laceration  Plan: Hard sole shoe and wound checks Ancef or keflex as an outpatient. Weight Bearing Status: WBAT in Hard sole shoe Start dressing changes in 1wk PT   I will sign off for now, wound check with me in 1-2 wks as inpatient or at my office.    Renette Butters, MD Cell [phone]   08/26/2013 8:24 PM

## 2013-08-26 NOTE — ED Notes (Signed)
Patient transported to CT 

## 2013-08-26 NOTE — ED Notes (Signed)
Report called to Margaret Mcgrath @ [facility], to discuss transfer to [facility] ED to be evaluated by the Orthro MD for further treatment of her right fifth toe injury. Stephannie Peters stated that she will call the family to give an update.

## 2013-08-26 NOTE — ED Notes (Signed)
Angelique Blonder, RN Hospice Nurse informed of plan of care.

## 2013-08-26 NOTE — ED Notes (Signed)
Per nsg home staff, pt was placed in hospice care last week.

## 2013-08-26 NOTE — ED Notes (Signed)
Foot injury/ laceration to right 5th digit.  EMS reports foot was caught in the wheelchair.

## 2013-08-27 ENCOUNTER — Inpatient Hospital Stay (HOSPITAL_COMMUNITY): Payer: Medicare Other

## 2013-08-27 DIAGNOSIS — T17908A Unspecified foreign body in respiratory tract, part unspecified causing other injury, initial encounter: Secondary | ICD-10-CM | POA: Diagnosis present

## 2013-08-27 DIAGNOSIS — N390 Urinary tract infection, site not specified: Secondary | ICD-10-CM | POA: Diagnosis present

## 2013-08-27 DIAGNOSIS — E870 Hyperosmolality and hypernatremia: Secondary | ICD-10-CM | POA: Diagnosis present

## 2013-08-27 DIAGNOSIS — M25559 Pain in unspecified hip: Secondary | ICD-10-CM | POA: Diagnosis present

## 2013-08-27 DIAGNOSIS — S92919B Unspecified fracture of unspecified toe(s), initial encounter for open fracture: Principal | ICD-10-CM

## 2013-08-27 LAB — CBC WITH DIFFERENTIAL/PLATELET
BASOS ABS: 0.1 10*3/uL (ref 0.0–0.1)
BASOS PCT: 1 % (ref 0–1)
EOS PCT: 4 % (ref 0–5)
Eosinophils Absolute: 0.3 10*3/uL (ref 0.0–0.7)
HCT: 32.2 % — ABNORMAL LOW (ref 36.0–46.0)
Hemoglobin: 10.4 g/dL — ABNORMAL LOW (ref 12.0–15.0)
LYMPHS PCT: 24 % (ref 12–46)
Lymphs Abs: 2.2 10*3/uL (ref 0.7–4.0)
MCH: 31.5 pg (ref 26.0–34.0)
MCHC: 32.3 g/dL (ref 30.0–36.0)
MCV: 97.6 fL (ref 78.0–100.0)
Monocytes Absolute: 0.7 10*3/uL (ref 0.1–1.0)
Monocytes Relative: 7 % (ref 3–12)
Neutro Abs: 5.9 10*3/uL (ref 1.7–7.7)
Neutrophils Relative %: 64 % (ref 43–77)
Platelets: 169 10*3/uL (ref 150–400)
RBC: 3.3 MIL/uL — ABNORMAL LOW (ref 3.87–5.11)
RDW: 14.1 % (ref 11.5–15.5)
WBC: 9.2 10*3/uL (ref 4.0–10.5)

## 2013-08-27 LAB — URINALYSIS, ROUTINE W REFLEX MICROSCOPIC
BILIRUBIN URINE: NEGATIVE
Glucose, UA: NEGATIVE mg/dL
HGB URINE DIPSTICK: NEGATIVE
Ketones, ur: NEGATIVE mg/dL
NITRITE: NEGATIVE
PH: 5.5 (ref 5.0–8.0)
Protein, ur: NEGATIVE mg/dL
SPECIFIC GRAVITY, URINE: 1.016 (ref 1.005–1.030)
Urobilinogen, UA: 1 mg/dL (ref 0.0–1.0)

## 2013-08-27 LAB — COMPREHENSIVE METABOLIC PANEL
ALBUMIN: 2.8 g/dL — AB (ref 3.5–5.2)
ALK PHOS: 187 U/L — AB (ref 39–117)
ALT: 14 U/L (ref 0–35)
AST: 25 U/L (ref 0–37)
BILIRUBIN TOTAL: 0.3 mg/dL (ref 0.3–1.2)
BUN: 35 mg/dL — ABNORMAL HIGH (ref 6–23)
CHLORIDE: 115 meq/L — AB (ref 96–112)
CO2: 22 mEq/L (ref 19–32)
Calcium: 9.2 mg/dL (ref 8.4–10.5)
Creatinine, Ser: 0.91 mg/dL (ref 0.50–1.10)
GFR calc Af Amer: 63 mL/min — ABNORMAL LOW (ref >90)
GFR calc non Af Amer: 55 mL/min — ABNORMAL LOW (ref >90)
GLUCOSE: 90 mg/dL (ref 70–99)
POTASSIUM: 4.2 meq/L (ref 3.7–5.3)
Sodium: 150 mEq/L — ABNORMAL HIGH (ref 137–147)
Total Protein: 6.3 g/dL (ref 6.0–8.3)

## 2013-08-27 LAB — SODIUM, URINE, RANDOM: SODIUM UR: 181 meq/L

## 2013-08-27 LAB — BASIC METABOLIC PANEL
BUN: 29 mg/dL — AB (ref 6–23)
CO2: 23 meq/L (ref 19–32)
CREATININE: 0.87 mg/dL (ref 0.50–1.10)
Calcium: 8.8 mg/dL (ref 8.4–10.5)
Chloride: 104 mEq/L (ref 96–112)
GFR calc Af Amer: 67 mL/min — ABNORMAL LOW (ref >90)
GFR calc non Af Amer: 58 mL/min — ABNORMAL LOW (ref >90)
Glucose, Bld: 116 mg/dL — ABNORMAL HIGH (ref 70–99)
Potassium: 4.4 mEq/L (ref 3.7–5.3)
Sodium: 139 mEq/L (ref 137–147)

## 2013-08-27 LAB — CREATININE, URINE, RANDOM: Creatinine, Urine: 32.02 mg/dL

## 2013-08-27 LAB — URINE MICROSCOPIC-ADD ON

## 2013-08-27 LAB — MRSA PCR SCREENING: MRSA by PCR: NEGATIVE

## 2013-08-27 LAB — OSMOLALITY: Osmolality: 303 mOsm/kg — ABNORMAL HIGH (ref 275–300)

## 2013-08-27 LAB — OSMOLALITY, URINE: OSMOLALITY UR: 587 mosm/kg (ref 390–1090)

## 2013-08-27 MED ORDER — DEXTROSE 5 % IV SOLN
INTRAVENOUS | Status: AC
  Administered 2013-08-27: 100 mL via INTRAVENOUS

## 2013-08-27 MED ORDER — STARCH (THICKENING) PO POWD
ORAL | Status: DC | PRN
  Filled 2013-08-27: qty 227

## 2013-08-27 MED ORDER — IOHEXOL 300 MG/ML  SOLN
25.0000 mL | INTRAMUSCULAR | Status: AC
  Administered 2013-08-27 (×2): 25 mL via ORAL

## 2013-08-27 MED ORDER — HALOPERIDOL LACTATE 5 MG/ML IJ SOLN: 2.0000 mg | Freq: Four times a day (QID) | INTRAMUSCULAR | Status: DC | PRN

## 2013-08-27 MED ORDER — IOHEXOL 300 MG/ML  SOLN
100.0000 mL | Freq: Once | INTRAMUSCULAR | Status: AC | PRN
  Administered 2013-08-27: 100 mL via INTRAVENOUS

## 2013-08-27 MED ORDER — DEXTROSE 5 % IV SOLN: INTRAVENOUS | Status: DC

## 2013-08-27 MED ORDER — CHLORHEXIDINE GLUCONATE 0.12 % MT SOLN
15.0000 mL | Freq: Two times a day (BID) | OROMUCOSAL | Status: DC
  Administered 2013-08-27 – 2013-08-28 (×3): 15 mL via OROMUCOSAL
  Filled 2013-08-27 (×5): qty 15

## 2013-08-27 MED ORDER — BIOTENE DRY MOUTH MT LIQD
15.0000 mL | Freq: Two times a day (BID) | OROMUCOSAL | Status: DC
  Administered 2013-08-27 – 2013-08-28 (×2): 15 mL via OROMUCOSAL

## 2013-08-27 MED ORDER — PANTOPRAZOLE SODIUM 40 MG IV SOLR
40.0000 mg | Freq: Every day | INTRAVENOUS | Status: DC
  Administered 2013-08-27 – 2013-08-28 (×3): 40 mg via INTRAVENOUS
  Filled 2013-08-27 (×3): qty 40

## 2013-08-27 MED ORDER — CARVEDILOL 3.125 MG PO TABS: 3.1250 mg | ORAL_TABLET | Freq: Two times a day (BID) | ORAL | Status: DC

## 2013-08-27 MED ORDER — HYDRALAZINE HCL 20 MG/ML IJ SOLN
10.0000 mg | Freq: Once | INTRAMUSCULAR | Status: AC
  Administered 2013-08-27: 10 mg via INTRAVENOUS
  Filled 2013-08-27: qty 1

## 2013-08-27 MED ORDER — HYDRALAZINE HCL 20 MG/ML IJ SOLN
10.0000 mg | Freq: Four times a day (QID) | INTRAMUSCULAR | Status: DC | PRN
  Administered 2013-08-27: 15:00:00 via INTRAVENOUS
  Filled 2013-08-27: qty 1

## 2013-08-27 MED ORDER — HYDRALAZINE HCL 25 MG PO TABS: 25.0000 mg | ORAL_TABLET | Freq: Three times a day (TID) | ORAL | Status: DC

## 2013-08-27 NOTE — Progress Notes (Signed)
Pt admitted to unit by bed to 5W37 , belongings at bedside. Patient is alert to self, responds to name but does not say her name when asked. In no apparent distress. IV intact. Pt presents with bruising to her right cheekbone, right hip, and bilateral knees. Pt's sacrum is intact, present with a reddened, but blanchable area to buttocks. Foam prophylaxis applied. Right foot is in pt shoe, with the fifth digit covered with gauze, no drainage or odor present. Pt from SNF, placed on orange precautions til MRSA ruled out. Call bell within reach, bed in the lowest position, and bed alarm in place. Will continue to monitor pt per MD orders.

## 2013-08-27 NOTE — Evaluation (Signed)
Clinical/Bedside Swallow Evaluation Patient Details  Name: Margaret Mcgrath MRN: [id number] Date of Birth: 10/05/1923  Today's Date: 08/27/2013 Time: 9892-1194 SLP Time Calculation (min): 15 min  Past Medical History:  Past Medical History  Diagnosis Date  . Depressed   . Hypertension   . Dementia     with psychosis  . Insomnia   . Anemia   . Mood disorder   . Uterine prolapse   . Vaginal prolapse   . Cystocele   . Incontinence   . Dysphagia     pureed diet  . Prediabetes   . Tinea cruris   . Weight loss     03/2011  . Vitamin B deficiency   . Right thyroid nodule   . Pneumonia     10/2010  . Dementia   . HTN (hypertension)   . Depression    Past Surgical History: History reviewed. No pertinent past surgical history. HPI:  80 y.o. year old female living i SNF PMH:  end stage dementia, dysphagia, pna, HTN presenting with foot injury (toe) after falling out of wheelchair and encephalopathy. Found to have 5th middle phalanx fracture. Report of worsening confusion, decreased appetite and head trauma over past week and being treated for UTI at nursing home.  MBS 06/2012 revealed decreased oral coordination/strength, decreased bolus cohesion, delayed transit with premature spillage into pharynx. Pt without aspiration or deep laryngeal penetration of any consistency tested.  Cough x1 observed during testing. Pharyngeal swallow was strong without residuals albeit mildly delayed at times.CT negative for bleed or other acute intracranial process.  CXR Stable cardiomegaly and chronic changes. No acute disease.    Assessment / Plan / Recommendation Clinical Impression  Pt. with evidence aspiration after thin liquids, primarily via straw; s/s aspiration mild with nectar. Honey thick liquid trials appeard to reduce s/s aspiration although post aspiration risk higher due to possible increased pharyngeal residue.  Increased respiratory effort following po trials. SLP recommends Dys 1, honey  thick liquids.  Pt. with chronic dysphagia and at risk for future admissions, history of pna, family education would be beneficial regarding dysphagia and dementia with progression, no family present.  SLP will continue to follow.    Aspiration Risk  Severe    Diet Recommendation Dysphagia 1 (Puree);Honey-thick liquid   Liquid Administration via: Cup;No straw Medication Administration: Crushed with puree Supervision: Patient able to self feed;Full supervision/cueing for compensatory strategies;Staff to assist with self feeding Compensations: Slow rate;Check for pocketing;Small sips/bites Postural Changes and/or Swallow Maneuvers: Seated upright 90 degrees;Upright 30-60 min after meal    Other  Recommendations Oral Care Recommendations: Oral care BID Other Recommendations: Order thickener from pharmacy   Follow Up Recommendations  Skilled Nursing facility    Frequency and Duration min 2x/week  2 weeks   Pertinent Vitals/Pain WDL         Swallow Study         Oral/Motor/Sensory Function Overall Oral Motor/Sensory Function:  (unable due to cognitive deficits )   Ice Chips Ice chips: Not tested   Thin Liquid Thin Liquid: Impaired Pharyngeal  Phase Impairments: Suspected delayed Swallow;Decreased hyoid-laryngeal movement;Cough - Immediate    Nectar Thick Nectar Thick Liquid: Impaired Presentation: Cup Pharyngeal Phase Impairments: Throat Clearing - Delayed;Decreased hyoid-laryngeal movement   Honey Thick Honey Thick Liquid: Impaired Presentation: Cup Pharyngeal Phase Impairments: Suspected delayed Swallow;Decreased hyoid-laryngeal movement   Puree Puree: Impaired Pharyngeal Phase Impairments: Suspected delayed Swallow;Decreased hyoid-laryngeal movement   Solid   GO Functional Assessment Tool Used: clinical judgement Functional  Limitations: Swallowing Swallow Current Status [phone](G8996): At least 80 percent but less than 100 percent impaired, limited or restricted Swallow  Goal Status [phone](G8997): At least 60 percent but less than 80 percent impaired, limited or restricted  Solid: Not tested       Royce MacadamiaLisa Willis Laiylah Roettger M.Ed [employer]SLP Pager [phone]  08/27/2013

## 2013-08-27 NOTE — Progress Notes (Signed)
PROGRESS NOTE  Margaret Mcgrath ZOX:[id number] DOB: [birth year] DOA: 08/26/2013 PCP: Thane Edu, MD  This is a 80 y.o. year old female with prior hx/o end stage dementia, HTN presenting with foot injury and encephalopathy. Pt lives in local SNF. Per report, pt accidentally fell out of wheelchair injuring R 5th toe. Pt presented to ER today because of toe injury.  There has also been report of worsening confusion and decreased appetite over past week. Pt had head trauma last week with some superficial lacerations s/p suture placement. Was also being treated for UTI at nursing home.  On presentation, afebrile. SBPs in 90s-200s. WBC 10.5. Hgb 11. Na 149. UA indicative of infection. Imaging including CXR, hip films, head CT negative for any acute abnormalities. Ortho consults with preliminary recs for wound wash out and stablization. Ortho recs pending.   Assessment/Plan:  UTI ?treated with keflex at SNF Culture pending On rocephin  Fractured 5th digit on right foot. Sutured in the ED. Dr. Renaye Rakers evaluated this am.  He recommends continued antibiotics (Keflex outpatient - Rocephin is fine inpatient). Follow up with Dr. Eulah Pont in 1 week.  Aspiration Evaluated by Speech and found to be aspirating on bedside swallow eval.  (MBSS not necessary) Speech recommends puree with honey thick.  HTN Labile BP Continue Home regimen.  Hypernatremia Sodium 150. Will place on D5 water for 1 liter.  Follow bmet. Urine osmolality, urine sodium, urine creatinine being checked.  Dementia Patient more confused than normal per daughter.  Hx of mood disorder Continue Trileptal.  Hip/Abdominal pain Could be related to UTI. Had a fall last week. Checking hip xray and CT scan of abdomen / pelvis.   DVT Prophylaxis:  Heparin TID  Code Status: DNR Family Communication: Dtr Kendal Hymen over the telephone. Disposition Plan: Return to SNF when appropriate.   Inpatient.   Consultants:    Antibiotics: Anti-infectives   Start     Dose/Rate Route Frequency Ordered Stop   08/26/13 2100  cefTRIAXone (ROCEPHIN) 1 g in dextrose 5 % 50 mL IVPB     1 g 100 mL/hr over 30 Minutes Intravenous Every 24 hours 08/26/13 2054     08/26/13 1445  cefTRIAXone (ROCEPHIN) 1 g in dextrose 5 % 50 mL IVPB  Status:  Discontinued     1 g 100 mL/hr over 30 Minutes Intravenous  Once 08/26/13 1437 08/26/13 1502   08/26/13 1345  ceFAZolin (ANCEF) IVPB 1 g/50 mL premix     1 g 100 mL/hr over 30 Minutes Intravenous  Once 08/26/13 1333 08/26/13 1430        HPI/Subjective: Patient does converse with me, but indicates that she has stomach and hip pain.  Objective: Filed Vitals:   08/27/13 0019 08/27/13 0441 08/27/13 0651 08/27/13 1122  BP: 162/80 197/65 156/72 158/70  Pulse: 68 61    Temp: 97.3 F (36.3 C) 97 F (36.1 C)    TempSrc: Axillary Axillary    Resp: 18 16    Weight:  61.4 kg (135 lb 5.8 oz)    SpO2: 94% 94%      Intake/Output Summary (Last 24 hours) at 08/27/13 1337 Last data filed at 08/27/13 0200  Gross per 24 hour  Intake    150 ml  Output     60 ml  Net     90 ml   Filed Weights   08/27/13 0441  Weight: 61.4 kg (135 lb 5.8 oz)    Exam: General: Well developed, well nourished, NAD,  does not speak. Grabs and holds my hands. HEENT:   Anicteic Sclera, MMM. No pharyngeal erythema or exudates  Neck: Supple, no JVD, no masses  Cardiovascular: RRR, S1 S2 auscultated, no rubs, murmurs or gallops.   Respiratory: Clear to auscultation bilaterally with equal chest rise  Abdomen: Soft, tender to palpation particularly in lower quadrants. + bowel sounds  Extremities: warm dry without cyanosis clubbing or edema.   Neuro: AAOx3, cranial nerves grossly intact. Strength 5/5 in upper and lower extremities  Skin: Without rashes exudates or nodules.  Large bruise on posterior thigh and face. Psych: advanced dementia.   Data Reviewed: Basic  Metabolic Panel:  Recent Labs Lab 08/26/13 1345 08/26/13 2129 08/27/13 0520  NA 149*  --  150*  K 4.5  --  4.2  CL 109  --  115*  CO2 25  --  22  GLUCOSE 104*  --  90  BUN 41*  --  35*  CREATININE 1.10 1.00 0.91  CALCIUM 9.9  --  9.2   Liver Function Tests:  Recent Labs Lab 08/26/13 1345 08/27/13 0520  AST 27 25  ALT 19 14  ALKPHOS 196* 187*  BILITOT 0.2* 0.3  PROT 7.5 6.3  ALBUMIN 3.4* 2.8*   CBC:  Recent Labs Lab 08/26/13 1345 08/26/13 2129 08/27/13 0520  WBC 10.5 7.9 9.2  NEUTROABS 7.9*  --  5.9  HGB 11.0* 10.3* 10.4*  HCT 34.0* 31.7* 32.2*  MCV 99.7 96.4 97.6  PLT 204 187 169     Recent Results (from the past 240 hour(s))  MRSA PCR SCREENING     Status: None   Collection Time    08/27/13 12:28 AM      Result Value Ref Range Status   MRSA by PCR NEGATIVE  NEGATIVE Final   Comment:            The GeneXpert MRSA Assay (FDA     approved for NASAL specimens     only), is one component of a     comprehensive MRSA colonization     surveillance program. It is not     intended to diagnose MRSA     infection nor to guide or     monitor treatment for     MRSA infections.     Studies: Dg Chest 1 View  08/26/2013   CLINICAL DATA:  foot injury, laceration  EXAM: CHEST - 1 VIEW  COMPARISON:  04/27/2013  FINDINGS: Relatively low lung volumes. Somewhat coarse interstitial markings but no confluent infiltrate or overt edema. Mild cardiomegaly. Tortuous atheromatous thoracic aorta. . Blunting of the left lateral costophrenic angle. Old bilateral rib fractures.  Surgical clips right upper abdomen.  IMPRESSION: 1. Stable cardiomegaly and chronic changes.  No acute disease.   Electronically Signed   By: Oley Balmaniel  Hassell M.D.   On: 08/26/2013 14:40   Dg Hip Complete Right  08/26/2013   CLINICAL DATA:  Injury.  Right hip pain  EXAM: RIGHT HIP - COMPLETE 2+ VIEW  COMPARISON:  08/15/2013  FINDINGS: There is no evidence of hip fracture or dislocation. There is no evidence  of arthropathy or other focal bone abnormality.  IMPRESSION: Negative.   Electronically Signed   By: Marlan Palauharles  Clark M.D.   On: 08/26/2013 16:11   Ct Head Wo Contrast  08/26/2013   CLINICAL DATA:  ams LACERATION  EXAM: CT HEAD WITHOUT CONTRAST  TECHNIQUE: Contiguous axial images were obtained from the base of the skull through the vertex without intravenous contrast.  COMPARISON:  08/14/2013  FINDINGS: Atherosclerotic and physiologic intracranial calcifications. Patient motion degrades some of the images despite repeating. 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 Diffuse parenchymal atrophy. Patchy areas of hypoattenuation in deep and periventricular white matter bilaterally. Negative for acute intracranial hemorrhage, mass lesion, acute infarction, midline shift, or mass-effect. Acute infarct may be inapparent on noncontrast CT. Ventricles and sulci symmetric. Bone windows demonstrate no focal lesion.  IMPRESSION: 1. Negative for bleed or other acute intracranial process.   Electronically Signed   By: Oley Balm M.D.   On: 08/26/2013 14:38   Dg Foot Complete Right  08/26/2013   CLINICAL DATA:  Traumatic injury and pain  EXAM: RIGHT FOOT COMPLETE - 3+ VIEW  COMPARISON:  None.  FINDINGS: Mild osteopenia is noted. There is apparent discontinuity in the fifth middle phalanx. This may be related to fracture although the area is obscured by overlying bandages. No other fracture is seen. No gross soft tissue abnormality is noted.  IMPRESSION: Apparent fracture of the fifth middle phalanx.   Electronically Signed   By: Alcide Clever M.D.   On: 08/26/2013 13:10    Scheduled Meds: . antiseptic oral rinse  15 mL Mouth Rinse q12n4p  . cefTRIAXone (ROCEPHIN)  IV  1 g Intravenous Q24H  . chlorhexidine  15 mL Mouth Rinse BID  . cholecalciferol  1,000 Units Oral Daily  . donepezil  10 mg Oral QHS  . ferrous sulfate  325 mg Oral Q breakfast  . heparin  5,000 Units Subcutaneous 3 times per day  . lisinopril  2.5 mg  Oral Daily  . memantine  5 mg Oral BID  . multivitamin with minerals  1 tablet Oral Daily  . Oxcarbazepine  300 mg Oral BID  . pantoprazole (PROTONIX) IV  40 mg Intravenous Daily  . vitamin B-12  1,000 mcg Oral Daily  . vitamin C  500 mg Oral Daily   Continuous Infusions: . dextrose 100 mL (08/27/13 0934)    Principal Problem:   UTI (urinary tract infection) Active Problems:   Foot injury   Hip pain   Hypernatremia   Aspiration into airway    Stephani Police, PA-C  [facility] Pager [phone]. If 7PM-7AM, please contact night-coverage at [email or web address], password [facility] 08/27/2013, 1:37 PM  LOS: 1 day

## 2013-08-27 NOTE — Progress Notes (Signed)
  I have directly reviewed the clinical findings, lab, imaging studies and management of this patient in detail. I have interviewed and examined the patient and agree with the documentation,  as recorded by the Physician extender.  Prashant K Singh M.D on 08/27/2013 at 2:21 PM  [facility] Office  [phone]  

## 2013-08-27 NOTE — Discharge Instructions (Signed)
Keep wound covered  Wear hard sole shoe at all times

## 2013-08-27 NOTE — Progress Notes (Signed)
UR completed. Patient changed to inpatient- requiring IV antibiotics.  

## 2013-08-28 LAB — COMPREHENSIVE METABOLIC PANEL
ALT: 13 U/L (ref 0–35)
AST: 29 U/L (ref 0–37)
Albumin: 2.9 g/dL — ABNORMAL LOW (ref 3.5–5.2)
Alkaline Phosphatase: 195 U/L — ABNORMAL HIGH (ref 39–117)
BUN: 25 mg/dL — ABNORMAL HIGH (ref 6–23)
CALCIUM: 9 mg/dL (ref 8.4–10.5)
CO2: 24 meq/L (ref 19–32)
CREATININE: 0.86 mg/dL (ref 0.50–1.10)
Chloride: 103 mEq/L (ref 96–112)
GFR, EST AFRICAN AMERICAN: 68 mL/min — AB (ref >90)
GFR, EST NON AFRICAN AMERICAN: 59 mL/min — AB (ref >90)
GLUCOSE: 85 mg/dL (ref 70–99)
Potassium: 4.3 mEq/L (ref 3.7–5.3)
Sodium: 138 mEq/L (ref 137–147)
TOTAL PROTEIN: 6.3 g/dL (ref 6.0–8.3)
Total Bilirubin: 0.3 mg/dL (ref 0.3–1.2)

## 2013-08-28 LAB — CBC WITH DIFFERENTIAL/PLATELET
BASOS PCT: 0 % (ref 0–1)
Basophils Absolute: 0 10*3/uL (ref 0.0–0.1)
Eosinophils Absolute: 0.3 10*3/uL (ref 0.0–0.7)
Eosinophils Relative: 4 % (ref 0–5)
HCT: 28.3 % — ABNORMAL LOW (ref 36.0–46.0)
HEMOGLOBIN: 9.4 g/dL — AB (ref 12.0–15.0)
LYMPHS ABS: 1.7 10*3/uL (ref 0.7–4.0)
Lymphocytes Relative: 21 % (ref 12–46)
MCH: 31.1 pg (ref 26.0–34.0)
MCHC: 33.2 g/dL (ref 30.0–36.0)
MCV: 93.7 fL (ref 78.0–100.0)
MONOS PCT: 7 % (ref 3–12)
Monocytes Absolute: 0.6 10*3/uL (ref 0.1–1.0)
NEUTROS ABS: 5.8 10*3/uL (ref 1.7–7.7)
Neutrophils Relative %: 68 % (ref 43–77)
Platelets: 178 10*3/uL (ref 150–400)
RBC: 3.02 MIL/uL — AB (ref 3.87–5.11)
RDW: 13.9 % (ref 11.5–15.5)
WBC: 8.4 10*3/uL (ref 4.0–10.5)

## 2013-08-28 MED ORDER — HYDROCODONE-ACETAMINOPHEN 5-325 MG PO TABS: 1.0000 | ORAL_TABLET | ORAL | Status: AC | PRN

## 2013-08-28 MED ORDER — DOXYCYCLINE HYCLATE 50 MG PO CAPS: 50.0000 mg | ORAL_CAPSULE | Freq: Two times a day (BID) | ORAL | Status: AC

## 2013-08-28 MED ORDER — HYDRALAZINE HCL 20 MG/ML IJ SOLN
5.0000 mg | Freq: Once | INTRAMUSCULAR | Status: AC
  Administered 2013-08-28: 5 mg via INTRAVENOUS
  Filled 2013-08-28: qty 1

## 2013-08-28 MED ORDER — BIOTENE DRY MOUTH MT LIQD: 15.0000 mL | Freq: Two times a day (BID) | OROMUCOSAL | Status: AC

## 2013-08-28 MED ORDER — LISINOPRIL 5 MG PO TABS: 5.0000 mg | ORAL_TABLET | Freq: Every day | ORAL | Status: AC

## 2013-08-28 MED ORDER — CEFUROXIME AXETIL 500 MG PO TABS: 500.0000 mg | ORAL_TABLET | Freq: Two times a day (BID) | ORAL | Status: AC

## 2013-08-28 MED ORDER — STARCH (THICKENING) PO POWD: 1.0000 g | ORAL | Status: DC | PRN

## 2013-08-28 NOTE — Clinical Social Work Note (Signed)
Per MD patient ready to DC back to ALF. RN, patient's daughter, and facility notified of DC. RN given number for report. DC packet on chart. Ambulance transport requested for 3:45pm to [facility]. CSW signing off at this time.  Roddie Mc MSW, [place], [place], [phone]   .

## 2013-08-28 NOTE — Clinical Social Work Note (Signed)
CSW notes that patient has not been transported by [facility]. CSW contacted evening ambulance service and they stated that they did not have patient on their list for pickup. CSW resubmitted request for transport to [facility]. CSW signing off at this time.   Roddie Mc MSW, [place], [place], [phone]

## 2013-08-28 NOTE — Care Management Note (Addendum)
    Page 1 of 1   08/29/2013     8:25:34 AM CARE MANAGEMENT NOTE 08/29/2013  Patient:  Margaret Mcgrath, Margaret Mcgrath   Account Number:  [id number]  Date Initiated:  08/28/2013  Documentation initiated by:  Letha Cape  Subjective/Objective Assessment:   dx foot injury, uti  admit- from [facility]     Action/Plan:   Anticipated DC Date:  08/28/2013   Anticipated DC Plan:  ASSISTED LIVING / REST HOME  In-house referral  Clinical Social Worker      DC Planning Services  CM consult      Choice offered to / List presented to:             Status of service:  Completed, signed off Medicare Important Message given?  NA - LOS <3 / Initial given by admissions (If response is "NO", the following Medicare IM given date fields will be blank) Date Medicare IM given:   Date Additional Medicare IM given:    Discharge Disposition:  ASSISTED LIVING  Per UR Regulation:  Reviewed for med. necessity/level of care/duration of stay  If discussed at Long Length of Stay Meetings, dates discussed:    Comments:

## 2013-08-28 NOTE — Clinical Social Work Psychosocial (Signed)
Clinical Social Work Department BRIEF PSYCHOSOCIAL ASSESSMENT 08/28/2013  Patient:  Margaret Mcgrath, Margaret Mcgrath     Account Number:  [id number]     Admit date:  08/26/2013  Clinical Social Worker:  Lavell Luster  Date/Time:  08/28/2013 10:30 AM  Referred by:  Physician  Date Referred:  08/28/2013 Referred for  ALF Placement   Other Referral:   Interview type:  Family Other interview type:   Patient's daughter interviewed as patient is unable to contribute to assessment.    PSYCHOSOCIAL DATA Living Status:  FACILITY Admitted from facility:  Other Level of care:  Assisted Living Primary support name:  Margaret Mcgrath Primary support relationship to patient:  CHILD, ADULT Degree of support available:   Support is strong.    CURRENT CONCERNS Current Concerns  Post-Acute Placement   Other Concerns:    SOCIAL WORK ASSESSMENT / PLAN CSW spoke with patient's daughter Margaret Mcgrath by phone as there was no family at bedside. Margaret Mcgrath confirms that patient is from [facility] and resides in the memory unit. Boonie states that she would like to look at long term SNF options for her mother but would be fine with her returning to [facility] and transitioning to long term SNF from [facility] [facility] if necessary. CSW explained that patient has been discharged and that if long term SNF placement could not be set up today that the patient would discharge back to [facility]. Margaret Mcgrath stated that she would rather her mom go back to [facility] so that she could spend more time determing where she would like her mom to live long term. Margaret Mcgrath also states that hospice services has just started following patient at ALF.   Assessment/plan status:  Psychosocial Support/Ongoing Assessment of Needs Other assessment/ plan:   Complete FL2, Fax   Information/referral to community resources:   CSW contact information given to patient's daughter.    PATIENT'S/FAMILY'S RESPONSE TO PLAN OF CARE: Patient's  daughter Margaret Mcgrath plans for her mother (patient) to be discharged back to [facility] in [facility] and to look at long term SNF options at a later date. Margaret Mcgrath was appreciative of CSW contact and is happy with the care her mother has received. CSW will assist with DC.       Roddie Mc MSW, [place], [place], [phone]

## 2013-08-28 NOTE — Discharge Summary (Signed)
Physician Discharge Summary  ROSELYN DOBY ZOX:[id number] DOB: 02/20/1924 DOA: 08/26/2013  PCP: Thane Edu, MD  Admit date: 08/26/2013 Discharge date: 08/28/2013  Time spent: 50 minutes  Recommendations for Outpatient Follow-up:  1. See Dr. Wandra Feinstein for right toe fracture in 1 week. Keep wound covered Wear hard sole shoe at all times 2. BMET on 6/1 3. Aspiration precautions, fall precautions 4. Check final urine culture. 5. Check BP.  Increased Lisinopril during hospital stay.  Discharge Diagnoses:  Principal Problem:   UTI (urinary tract infection) Active Problems:   Foot injury   Hip pain   Hypernatremia   Aspiration into airway   Discharge Condition: stable   Diet recommendation: puree with honey thick liquids  Filed Weights   08/27/13 0441 08/28/13 0450  Weight: 61.4 kg (135 lb 5.8 oz) 61.1 kg (134 lb 11.2 oz)    History of present illness at the time of admission: This is a 80 y.o. year old female with prior hx/o end stage dementia, HTN presenting with foot injury and encephalopathy. Pt lives in local SNF. Per report, pt accidentally fell out of wheelchair injuring R 5th toe. Pt presented to ER today because of toe injury. Nopted 5th middle phalanx fracture. There has also been report of worsening confusion and decreased appetite over past week. Pt had head trauma last week with some superficial lacerations s/p suture placement. No nausea. + mild decreased appetite. Was also being treated for UTI at nursing home. On presentation, afebrile. SBPs in 90s-200s. WBC 10.5. Hgb 11. Na 149. UA indicative of infection. Imaging including CXR, hip films, head CT negative for any acute abnormalities. Ortho consults with preliminary recs for wound wash out and stablization.    Hospital Course:  UTI  ?treated with keflex at SNF  Culture pending at the time of discharge On rocephin inpatient.  Will discharge on 4 more days of Ceftin.  Fractured 5th digit on right  foot.  Sutured in the ED.  Dr. Renaye Rakers evaluated this am. He recommends continued antibiotics.  Will give 5 days of Doxycycline after discharge. Follow up with Dr. Eulah Pont in 1 week.   Aspiration  Evaluated by Speech and found to be aspirating on bedside swallow eval. (MBSS not necessary)  Speech recommends puree with honey thick.  Speech therapy at SNF  HTN  Labile BP  Continue Home regimen. Increased lisinopril to 5 mg daily.  Hypernatremia  Sodium 150. Will place on D5 water for 1 liter. Follow bmet.  Urine osmolality was elevated.  Indicating dehydration. Sodium normalized with IVF.  Dementia  Patient more confused than normal per daughter at the time of admission. On 5/28 patient speaking.  More alert and conversant.  Hx of mood disorder  Continue Trileptal.   Hip/Abdominal pain  Resolved on 5/28 Could be related to UTI.  Had a fall last week.  Pelvic/hip xrays are negative.  CT abd/pelvis was negative.   Discharge Exam: Filed Vitals:   08/28/13 0648  BP: 136/58  Pulse:   Temp:   Resp:    General: Well developed, well nourished, NAD,  Grabs and holds my hands. Conversant. HEENT: Anicteic Sclera, MMM. No pharyngeal erythema or exudates  Neck: Supple, no JVD, no masses  Cardiovascular: RRR, S1 S2 auscultated, no rubs, murmurs or gallops.  Respiratory: Clear to auscultation bilaterally with equal chest rise  Abdomen: Soft, tender to palpation particularly in lower quadrants. + bowel sounds  Extremities: warm dry without cyanosis clubbing or edema.  Neuro: AAOx3, cranial  nerves grossly intact. Strength 5/5 in upper and lower extremities  Skin: Without rashes exudates or nodules. Large bruise on posterior thigh and face.  Psych: advanced dementia.   Discharge Instructions      Discharge Instructions   Diet - low sodium heart healthy    Complete by:  As directed      Increase activity slowly    Complete by:  As directed             Medication List     STOP taking these medications       cephALEXin 500 MG capsule  Commonly known as:  KEFLEX      TAKE these medications       antiseptic oral rinse Liqd  15 mLs by Mouth Rinse route 2 times daily at 12 noon and 4 pm.     BAZA PROTECT EX  Apply 1 application topically See admin instructions. Apply to buttocks three times daily and as needed after each incontinent episode     cefUROXime 500 MG tablet  Commonly known as:  CEFTIN  Take 1 tablet (500 mg total) by mouth 2 (two) times daily with a meal.     cetirizine 10 MG tablet  Commonly known as:  ZYRTEC  Take 10 mg by mouth at bedtime.     cholecalciferol 1000 UNITS tablet  Commonly known as:  VITAMIN D  Take 1,000 Units by mouth daily.     donepezil 10 MG tablet  Commonly known as:  ARICEPT  Take 10 mg by mouth at bedtime.     doxycycline 50 MG capsule  Commonly known as:  VIBRAMYCIN  Take 1 capsule (50 mg total) by mouth 2 (two) times daily.     ferrous sulfate 325 (65 FE) MG tablet  Take 325 mg by mouth daily with breakfast.     food thickener Powd  Commonly known as:  THICK IT  Take 1 g by mouth as needed (dysphagia).     HYDROcodone-acetaminophen 5-325 MG per tablet  Commonly known as:  NORCO/VICODIN  Take 1 tablet by mouth every 4 (four) hours as needed (pain).     lisinopril 5 MG tablet  Commonly known as:  PRINIVIL,ZESTRIL  Take 1 tablet (5 mg total) by mouth daily.     multivitamin with minerals Tabs tablet  Take 1 tablet by mouth daily.     NAMENDA XR 14 MG Cp24  Generic drug:  Memantine HCl ER  Take 14 mg by mouth at bedtime.     omeprazole 20 MG capsule  Commonly known as:  PRILOSEC  Take 20 mg by mouth daily.     Oxcarbazepine 300 MG tablet  Commonly known as:  TRILEPTAL  Take 300 mg by mouth 2 (two) times daily.     vitamin B-12 1000 MCG tablet  Commonly known as:  CYANOCOBALAMIN  Take 1,000 mcg by mouth daily.     vitamin C 500 MG tablet  Commonly known as:  ASCORBIC ACID  Take 500 mg  by mouth daily.       Allergies  Allergen Reactions  . Chlorethamine [Ethylenediamine]     Unknown reaction  . Thorazine [Chlorpromazine Hcl] Other (See Comments)    Per MAR   Follow-up Information   Follow up with MURPHY, TIMOTHY, D, MD In 1 week.   Specialty:  Orthopedic Surgery   Contact information:   [address] [place] [address] [phone]        The results  of significant diagnostics from this hospitalization (including imaging, microbiology, ancillary and laboratory) are listed below for reference.    Significant Diagnostic Studies: Dg Chest 1 View  08/26/2013   CLINICAL DATA:  foot injury, laceration  EXAM: CHEST - 1 VIEW  COMPARISON:  04/27/2013  FINDINGS: Relatively low lung volumes. Somewhat coarse interstitial markings but no confluent infiltrate or overt edema. Mild cardiomegaly. Tortuous atheromatous thoracic aorta. . Blunting of the left lateral costophrenic angle. Old bilateral rib fractures.  Surgical clips right upper abdomen.  IMPRESSION: 1. Stable cardiomegaly and chronic changes.  No acute disease.   Electronically Signed   By: Oley Balmaniel  Hassell M.D.   On: 08/26/2013 14:40    Dg Hip Complete Right  08/26/2013   CLINICAL DATA:  Injury.  Right hip pain  EXAM: RIGHT HIP - COMPLETE 2+ VIEW  COMPARISON:  08/15/2013  FINDINGS: There is no evidence of hip fracture or dislocation. There is no evidence of arthropathy or other focal bone abnormality.  IMPRESSION: Negative.   Electronically Signed   By: Marlan Palauharles  Clark M.D.   On: 08/26/2013 16:11    Dg Hip Bilateral W/pelvis  08/27/2013   CLINICAL DATA:  Bilateral hip pain.  No known injury.  EXAM: BILATERAL HIP WITH PELVIS - 4+ VIEW  COMPARISON:  08/13/2013.  CT 08/27/2013.  FINDINGS: Mild degenerative changes in the hips bilaterally with joint space narrowing and spurring. Diffuse osteopenia. SI joints are symmetric and unremarkable. No fracture, subluxation or dislocation.  IMPRESSION: No acute  bony abnormality.   Electronically Signed   By: Charlett NoseKevin  Dover M.D.   On: 08/27/2013 16:10    Ct Abdomen Pelvis W Contrast  08/27/2013   CLINICAL DATA:  Abdominal pain  EXAM: CT ABDOMEN AND PELVIS WITH CONTRAST  TECHNIQUE: Multidetector CT imaging of the abdomen and pelvis was performed using the standard protocol following bolus administration of intravenous contrast. Examination is significantly limited by patient motion artifact.  CONTRAST:  100mL OMNIPAQUE IOHEXOL 300 MG/ML  SOLN  COMPARISON:  08/10/2003  FINDINGS: Lung bases are free of acute infiltrate or sizable effusion. A small right middle lobe nodule is again identified and stable from 2005.  The gallbladder is been surgically removed. The liver, spleen, adrenal glands and pancreas are within normal limits. The cystic lesion arising from the pancreas on the prior exam is not visualized on this study. Mild ectasia of the abdominal aorta is noted with calcification. The kidneys are well visualized bilaterally and show no focal abnormality. Scattered diverticular change of the colon is seen. No evidence of diverticulitis is noted. The bladder is partially distended. No pelvic mass lesion is seen. No significant lymphadenopathy is noted. The osseous structures show degenerative change of the thoracic and lumbar spine.  IMPRESSION: The previously seen pancreatic cystic lesion is no longer identified.  Chronic changes as described without acute abnormality.   Electronically Signed   By: Alcide CleverMark  Lukens M.D.   On: 08/27/2013 15:29   Dg Foot Complete Right  08/26/2013   CLINICAL DATA:  Traumatic injury and pain  EXAM: RIGHT FOOT COMPLETE - 3+ VIEW  COMPARISON:  None.  FINDINGS: Mild osteopenia is noted. There is apparent discontinuity in the fifth middle phalanx. This may be related to fracture although the area is obscured by overlying bandages. No other fracture is seen. No gross soft tissue abnormality is noted.  IMPRESSION: Apparent fracture of the fifth  middle phalanx.   Electronically Signed   By: Alcide CleverMark  Lukens M.D.   On: 08/26/2013 13:10  Microbiology: Recent Results (from the past 240 hour(s))  MRSA PCR SCREENING     Status: None   Collection Time    08/27/13 12:28 AM      Result Value Ref Range Status   MRSA by PCR NEGATIVE  NEGATIVE Final   Comment:            The GeneXpert MRSA Assay (FDA     approved for NASAL specimens     only), is one component of a     comprehensive MRSA colonization     surveillance program. It is not     intended to diagnose MRSA     infection nor to guide or     monitor treatment for     MRSA infections.     Labs: Basic Metabolic Panel:  Recent Labs Lab 08/26/13 1345 08/26/13 2129 08/27/13 0520 08/27/13 1842 08/28/13 0543  NA 149*  --  150* 139 138  K 4.5  --  4.2 4.4 4.3  CL 109  --  115* 104 103  CO2 25  --  22 23 24   GLUCOSE 104*  --  90 116* 85  BUN 41*  --  35* 29* 25*  CREATININE 1.10 1.00 0.91 0.87 0.86  CALCIUM 9.9  --  9.2 8.8 9.0   Liver Function Tests:  Recent Labs Lab 08/26/13 1345 08/27/13 0520 08/28/13 0543  AST 27 25 29   ALT 19 14 13   ALKPHOS 196* 187* 195*  BILITOT 0.2* 0.3 0.3  PROT 7.5 6.3 6.3  ALBUMIN 3.4* 2.8* 2.9*   CBC:  Recent Labs Lab 08/26/13 1345 08/26/13 2129 08/27/13 0520 08/28/13 0543  WBC 10.5 7.9 9.2 8.4  NEUTROABS 7.9*  --  5.9 5.8  HGB 11.0* 10.3* 10.4* 9.4*  HCT 34.0* 31.7* 32.2* 28.3*  MCV 99.7 96.4 97.6 93.7  PLT 204 187 169 178       Signed:  Conley Canal [phone] [facility] 08/28/2013, 8:50 AM

## 2013-08-28 NOTE — Progress Notes (Signed)
Saline lock removed.Went to [facility] in fair condition.

## 2013-08-28 NOTE — Discharge Summary (Signed)
  I have directly reviewed the clinical findings, lab, imaging studies and management of this patient in detail. I have interviewed and examined the patient and agree with the documentation,  as recorded by the Physician extender.  Leroy Sea M.D on 08/28/2013 at 2:19 PM  [facility] Office  [phone]

## 2013-08-28 NOTE — Evaluation (Signed)
Physical Therapy Evaluation Patient Details Name: Margaret Mcgrath MRN: [id number] DOB: November 11, 1923 Today's Date: 08/28/2013   History of Present Illness  80 y.o. year old female with prior hx/o end stage dementia, HTN presenting with foot injury and encephalopathy. Pt lives in local SNF. Per report, pt accidentally fell out of wheelchair injuring R 5th toe. Pt presented to ER today because of toe injury. Nopted 5th middle phalanx fracture.  Ortho consults with preliminary recs for wound wash out and stablization.   Clinical Impression  Pt adm due to the above. Pt presents with decreased independence with functional mobility. No family present to determine baseline. Believe pt would have rehab potential at next venue secondary to her ability to follow commands >50% of session and (A) with her mobility. Pt to benefit from skilled acute PT to address deficits indicated below and maximize functional mobility.     Follow Up Recommendations SNF;Supervision/Assistance - 24 hour;Other (comment) (ALF vs SNF pending rehab and (A) available )    Equipment Recommendations  Other (comment) (TBD)    Recommendations for Other Services       Precautions / Restrictions Precautions Precautions: Fall Required Braces or Orthoses: Other Brace/Splint Other Brace/Splint: post op shoe Restrictions Weight Bearing Restrictions: Yes RLE Weight Bearing: Weight bearing as tolerated Other Position/Activity Restrictions: WBAT with post op shoe       Mobility  Bed Mobility Overal bed mobility: +2 for physical assistance;Needs Assistance Bed Mobility: Supine to Sit;Sit to Supine;Rolling Rolling: Min assist   Supine to sit: +2 for physical assistance;Min assist;HOB elevated Sit to supine: +2 for physical assistance;Mod assist   General bed mobility comments: pt able to follow on step commands >50% of time with incr time; cues for hand placement and sequencing; pt minimally able to engage abdomen and use UEs to  assit with transfers   Transfers Overall transfer level: Needs assistance Equipment used: 2 person hand held assist Transfers: Sit to/from Stand Sit to Stand: +2 physical assistance;Max assist         General transfer comment: pt able to attempt to push up through LEs; requires 2 person (A) to achieve upright standing position; tolerated for ~1 min and returned to sitting EOB; max cues for hand placement and sequencing  Ambulation/Gait             General Gait Details: attempted lateral steps towards HOB; pt unable to complete and returned to sititng position at [employer]            Wheelchair Mobility    Modified Rankin (Stroke Patients Only)       Balance Overall balance assessment: History of Falls;Needs assistance Sitting-balance support: Feet supported;No upper extremity supported Sitting balance-Leahy Scale: Fair Sitting balance - Comments: pt able to reach out BOS with UEs; at supervision level sititng EOB; tolerated ~7 min   Standing balance support: During functional activity;Bilateral upper extremity supported Standing balance-Leahy Scale: Zero Standing balance comment: requires 2 person (A)                             Pertinent Vitals/Pain No c/o pain during session     Home Living Family/patient expects to be discharged to:: Skilled nursing facility                 Additional Comments: pt from ALF memory care facility     Prior Function Level of Independence: Needs assistance  Comments: no family present to determine PLOF; per chart pt was wheelchair bound; with presentation pt likely to require total (A)      Hand Dominance        Extremity/Trunk Assessment   Upper Extremity Assessment: Defer to OT evaluation           Lower Extremity Assessment: Generalized weakness      Cervical / Trunk Assessment: Kyphotic;Other exceptions  Communication   Communication: Expressive difficulties  Cognition  Arousal/Alertness: Awake/alert Behavior During Therapy: Flat affect Overall Cognitive Status: History of cognitive impairments - at baseline       Memory: Decreased short-term memory              General Comments      Exercises General Exercises - Lower Extremity Long Arc Quad: AROM;Strengthening;Both;5 reps;Other (comment) (max cues to follow commands )      Assessment/Plan    PT Assessment Patient needs continued PT services  PT Diagnosis Difficulty walking;Acute pain;Generalized weakness   PT Problem List Decreased strength;Decreased activity tolerance;Decreased balance;Decreased mobility;Decreased cognition;Decreased knowledge of use of DME;Decreased safety awareness;Decreased knowledge of precautions;Pain  PT Treatment Interventions DME instruction;Functional mobility training;Therapeutic activities;Therapeutic exercise;Balance training;Neuromuscular re-education;Patient/family education;Wheelchair mobility training;Cognitive remediation;Gait training   PT Goals (Current goals can be found in the Care Plan section) Acute Rehab PT Goals Patient Stated Goal: none stated PT Goal Formulation: Patient unable to participate in goal setting Time For Goal Achievement: 09/04/13 Potential to Achieve Goals: Fair    Frequency Min 2X/week   Barriers to discharge   pt from ALF; uncertain amount of (A) ALF can provide     Co-evaluation               End of Session Equipment Utilized During Treatment: Gait belt Activity Tolerance: Patient tolerated treatment well Patient left: in bed;with call bell/phone within reach;with bed alarm set Nurse Communication: Mobility status;Other (comment) (NT to clean; pt soiled )         Time: [id number] PT Time Calculation (min): 15 min   Charges:   PT Evaluation $Initial PT Evaluation Tier I: 1 Procedure PT Treatments $Therapeutic Activity: 8-22 mins   PT G CodesNadara Mustard:          Shenee Wignall N [place], [place]PT  [phone] 08/28/2013, 4:54  PM

## 2013-08-30 LAB — URINE CULTURE

## 2013-11-03 ENCOUNTER — Emergency Department (HOSPITAL_BASED_OUTPATIENT_CLINIC_OR_DEPARTMENT_OTHER): Payer: Medicare Other

## 2013-11-03 ENCOUNTER — Emergency Department (HOSPITAL_BASED_OUTPATIENT_CLINIC_OR_DEPARTMENT_OTHER)
Admission: EM | Admit: 2013-11-03 | Discharge: 2013-11-03 | Disposition: A | Discharge status: Home / Self Care | Payer: Medicare Other | Attending: Emergency Medicine | Admitting: Emergency Medicine

## 2013-11-03 ENCOUNTER — Encounter (HOSPITAL_BASED_OUTPATIENT_CLINIC_OR_DEPARTMENT_OTHER): Payer: Self-pay | Admitting: Emergency Medicine

## 2013-11-03 DIAGNOSIS — D649 Anemia, unspecified: Secondary | ICD-10-CM | POA: Diagnosis not present

## 2013-11-03 DIAGNOSIS — Z8701 Personal history of pneumonia (recurrent): Secondary | ICD-10-CM | POA: Insufficient documentation

## 2013-11-03 DIAGNOSIS — N390 Urinary tract infection, site not specified: Secondary | ICD-10-CM | POA: Insufficient documentation

## 2013-11-03 DIAGNOSIS — F0390 Unspecified dementia without behavioral disturbance: Secondary | ICD-10-CM | POA: Diagnosis not present

## 2013-11-03 DIAGNOSIS — Y939 Activity, unspecified: Secondary | ICD-10-CM | POA: Insufficient documentation

## 2013-11-03 DIAGNOSIS — R296 Repeated falls: Secondary | ICD-10-CM | POA: Diagnosis not present

## 2013-11-03 DIAGNOSIS — Z79899 Other long term (current) drug therapy: Secondary | ICD-10-CM | POA: Insufficient documentation

## 2013-11-03 DIAGNOSIS — E539 Vitamin B deficiency, unspecified: Secondary | ICD-10-CM | POA: Insufficient documentation

## 2013-11-03 DIAGNOSIS — W19XXXA Unspecified fall, initial encounter: Secondary | ICD-10-CM

## 2013-11-03 DIAGNOSIS — Y929 Unspecified place or not applicable: Secondary | ICD-10-CM | POA: Insufficient documentation

## 2013-11-03 DIAGNOSIS — Z792 Long term (current) use of antibiotics: Secondary | ICD-10-CM | POA: Diagnosis not present

## 2013-11-03 DIAGNOSIS — IMO0002 Reserved for concepts with insufficient information to code with codable children: Secondary | ICD-10-CM | POA: Insufficient documentation

## 2013-11-03 DIAGNOSIS — I10 Essential (primary) hypertension: Secondary | ICD-10-CM | POA: Insufficient documentation

## 2013-11-03 DIAGNOSIS — Z8742 Personal history of other diseases of the female genital tract: Secondary | ICD-10-CM | POA: Diagnosis not present

## 2013-11-03 DIAGNOSIS — Z8619 Personal history of other infectious and parasitic diseases: Secondary | ICD-10-CM | POA: Insufficient documentation

## 2013-11-03 LAB — URINALYSIS, ROUTINE W REFLEX MICROSCOPIC
Bilirubin Urine: NEGATIVE
GLUCOSE, UA: NEGATIVE mg/dL
Hgb urine dipstick: NEGATIVE
Ketones, ur: NEGATIVE mg/dL
NITRITE: NEGATIVE
PH: 7 (ref 5.0–8.0)
Protein, ur: NEGATIVE mg/dL
Specific Gravity, Urine: 1.008 (ref 1.005–1.030)
Urobilinogen, UA: 1 mg/dL (ref 0.0–1.0)

## 2013-11-03 LAB — URINE MICROSCOPIC-ADD ON

## 2013-11-03 MED ORDER — LORAZEPAM 2 MG/ML IJ SOLN
1.0000 mg | Freq: Once | INTRAMUSCULAR | Status: AC
  Administered 2013-11-03: 1 mg via INTRAVENOUS
  Filled 2013-11-03: qty 1

## 2013-11-03 MED ORDER — CEPHALEXIN 500 MG PO CAPS: 500.0000 mg | ORAL_CAPSULE | Freq: Two times a day (BID) | ORAL | Status: AC

## 2013-11-03 MED ORDER — LORAZEPAM 2 MG/ML IJ SOLN
1.0000 mg | Freq: Once | INTRAMUSCULAR | Status: AC
  Administered 2013-11-03: 1 mg via INTRAMUSCULAR
  Filled 2013-11-03: qty 1

## 2013-11-03 NOTE — ED Notes (Signed)
Nursing staff from [facility] called requesting that pt have a urinalysis while here.

## 2013-11-03 NOTE — ED Provider Notes (Signed)
This chart was scribed for Layla Maw Candis Kabel, DO by Modena Jansky, ED Scribe. This patient was seen in room MH12/MH12 and the patient's care was started at 5:45 PM.  TIME SEEN: 5:45 PM  CHIEF COMPLAINT: Fall  HPI: Margaret Mcgrath is a 80 y.o. female a history of hypertension, dementia who presents to the Emergency Department complaining of a fall that occurred today. She is unable to provide any history given her dementia. Fall was unwitnessed. She is not on anticoagulation. She is not complaining of any pain until I begin coughing her back and that she complains of midthoracic pain.   ROS:  Level 5 Caveat due to dementia     PAST MEDICAL HISTORY/PAST SURGICAL HISTORY:  Past Medical History  Diagnosis Date  . Depressed   . Hypertension   . Dementia     with psychosis  . Insomnia   . Anemia   . Mood disorder   . Uterine prolapse   . Vaginal prolapse   . Cystocele   . Incontinence   . Dysphagia     pureed diet  . Prediabetes   . Tinea cruris   . Weight loss     03/2011  . Vitamin B deficiency   . Right thyroid nodule   . Pneumonia     10/2010  . Dementia   . HTN (hypertension)   . Depression     MEDICATIONS:  Prior to Admission medications   Medication Sig Start Date End Date Taking? Authorizing Provider  antiseptic oral rinse (BIOTENE) LIQD 15 mLs by Mouth Rinse route 2 times daily at 12 noon and 4 pm. 08/28/13   Stephani Police, PA-C  cefUROXime (CEFTIN) 500 MG tablet Take 1 tablet (500 mg total) by mouth 2 (two) times daily with a meal. 08/28/13   Stephani Police, PA-C  cetirizine (ZYRTEC) 10 MG tablet Take 10 mg by mouth at bedtime.    Historical Provider, MD  cholecalciferol (VITAMIN D) 1000 UNITS tablet Take 1,000 Units by mouth daily.      Historical Provider, MD  donepezil (ARICEPT) 10 MG tablet Take 10 mg by mouth at bedtime.    Historical Provider, MD  doxycycline (VIBRAMYCIN) 50 MG capsule Take 1 capsule (50 mg total) by mouth 2 (two) times daily. 08/28/13    Stephani Police, PA-C  ferrous sulfate 325 (65 FE) MG tablet Take 325 mg by mouth daily with breakfast.    Historical Provider, MD  food thickener (THICK IT) POWD Take 1 g by mouth as needed (dysphagia). 08/28/13   Stephani Police, PA-C  HYDROcodone-acetaminophen (NORCO/VICODIN) 5-325 MG per tablet Take 1 tablet by mouth every 4 (four) hours as needed (pain). 08/28/13   Tora Kindred York, PA-C  lisinopril (PRINIVIL,ZESTRIL) 5 MG tablet Take 1 tablet (5 mg total) by mouth daily. 08/28/13   Stephani Police, PA-C  Memantine HCl ER (NAMENDA XR) 14 MG CP24 Take 14 mg by mouth at bedtime.     Historical Provider, MD  Multiple Vitamin (MULTIVITAMIN WITH MINERALS) TABS tablet Take 1 tablet by mouth daily.    Historical Provider, MD  omeprazole (PRILOSEC) 20 MG capsule Take 20 mg by mouth daily.    Historical Provider, MD  Oxcarbazepine (TRILEPTAL) 300 MG tablet Take 300 mg by mouth 2 (two) times daily.    Historical Provider, MD  Skin Protectants, Misc. (BAZA PROTECT EX) Apply 1 application topically See admin instructions. Apply to buttocks three times daily and as needed after each incontinent episode  Historical Provider, MD  vitamin B-12 (CYANOCOBALAMIN) 1000 MCG tablet Take 1,000 mcg by mouth daily.    Historical Provider, MD  vitamin C (ASCORBIC ACID) 500 MG tablet Take 500 mg by mouth daily.    Historical Provider, MD    ALLERGIES:  Allergies  Allergen Reactions  . Chlorethamine [Ethylenediamine]     Unknown reaction  . Thorazine [Chlorpromazine Hcl] Other (See Comments)    Per MAR    SOCIAL HISTORY:  History  Substance Use Topics  . Smoking status: Unknown If Ever Smoked  . Smokeless tobacco: Not on file  . Alcohol Use: No    FAMILY HISTORY: History reviewed. No pertinent family history.  EXAM: BP 173/52  Pulse 66  Temp(Src) 97.7 F (36.5 C) (Axillary)  Resp 18  Ht 5\' 7"  (1.702 m)  Wt 145 lb (65.772 kg)  BMI 22.71 kg/m2  SpO2 100% CONSTITUTIONAL: Alert and oriented to person  and responds appropriately to questions only intermittently. Elderly, slightly combative, no apparent distress HEAD: Normocephalic; atraumatic EYES: Conjunctivae clear, PERRL, EOMI ENT: normal nose; no rhinorrhea; moist mucous membranes; pharynx without lesions noted; no dental injury; no hemotypanum; no septal hematoma NECK: Supple, no meningismus, no LAD; no midline spinal tenderness, step-off or deformity CARD: RRR; S1 and S2 appreciated; no murmurs, no clicks, no rubs, no gallops RESP: Normal chest excursion without splinting or tachypnea; breath sounds clear and equal bilaterally; no wheezes, no rhonchi, no rales; chest wall stable, nontender to palpation ABD/GI: Normal bowel sounds; non-distended; soft, non-tender, no rebound, no guarding PELVIS:  stable, nontender to palpation BACK:  Thoracic area tenderness with no step offs or deformities. No other lesions noted. EXT: Normal ROM in all joints; non-tender to palpation; no edema; normal capillary refill; no cyanosis    SKIN: Normal color for age and race; warm NEURO: Moves all extremities equally, sensation to light touch intact diffusely, cranial nerves II through XII intact PSYCH: The patient's mood and manner are appropriate. Grooming and personal hygiene are appropriate.  MEDICAL DECISION MAKING: Patient here with unwitnessed fall. She has history of dementia and is unable to provide history. She is at her baseline currently and hemodynamically stable. She is neurologically intact. We'll obtain CT imaging of her head, cervical spine and x-rays of her thoracic spine.  ED PROGRESS: He should imaging shows no acute injury. She does appear to have a UTI. Culture pending. We'll discharge with prescription for Keflex. We'll discharge back to her nursing facility.    I personally performed the services described in this documentation, which was scribed in my presence. The recorded information has been reviewed and is  accurate.      Layla MawKristen N Magdala Brahmbhatt, DO 11/03/13 2230

## 2013-11-03 NOTE — ED Notes (Signed)
Patient transported to CT 

## 2013-11-03 NOTE — ED Notes (Signed)
Pt returned from CT due to not being able to lay still for testing, RN made aware.

## 2013-11-03 NOTE — Discharge Instructions (Signed)
Urinary Tract Infection Urinary tract infections (UTIs) can develop anywhere along your urinary tract. Your urinary tract is your body's drainage system for removing wastes and extra water. Your urinary tract includes two kidneys, two ureters, a bladder, and a urethra. Your kidneys are a pair of bean-shaped organs. Each kidney is about the size of your fist. They are located below your ribs, one on each side of your spine. CAUSES Infections are caused by microbes, which are microscopic organisms, including fungi, viruses, and bacteria. These organisms are so small that they can only be seen through a microscope. Bacteria are the microbes that most commonly cause UTIs. SYMPTOMS  Symptoms of UTIs may vary by age and gender of the patient and by the location of the infection. Symptoms in young women typically include a frequent and intense urge to urinate and a painful, burning feeling in the bladder or urethra during urination. Older women and men are more likely to be tired, shaky, and weak and have muscle aches and abdominal pain. A fever may mean the infection is in your kidneys. Other symptoms of a kidney infection include pain in your back or sides below the ribs, nausea, and vomiting. DIAGNOSIS To diagnose a UTI, your caregiver will ask you about your symptoms. Your caregiver also will ask to provide a urine sample. The urine sample will be tested for bacteria and white blood cells. White blood cells are made by your body to help fight infection. TREATMENT  Typically, UTIs can be treated with medication. Because most UTIs are caused by a bacterial infection, they usually can be treated with the use of antibiotics. The choice of antibiotic and length of treatment depend on your symptoms and the type of bacteria causing your infection. HOME CARE INSTRUCTIONS  If you were prescribed antibiotics, take them exactly as your caregiver instructs you. Finish the medication even if you feel better after you  have only taken some of the medication.  Drink enough water and fluids to keep your urine clear or pale yellow.  Avoid caffeine, tea, and carbonated beverages. They tend to irritate your bladder.  Empty your bladder often. Avoid holding urine for long periods of time.  Empty your bladder before and after sexual intercourse.  After a bowel movement, women should cleanse from front to back. Use each tissue only once. SEEK MEDICAL CARE IF:   You have back pain.  You develop a fever.  Your symptoms do not begin to resolve within 3 days. SEEK IMMEDIATE MEDICAL CARE IF:   You have severe back pain or lower abdominal pain.  You develop chills.  You have nausea or vomiting.  You have continued burning or discomfort with urination. MAKE SURE YOU:   Understand these instructions.  Will watch your condition.  Will get help right away if you are not doing well or get worse. Document Released: 12/28/2004 Document Revised: 09/19/2011 Document Reviewed: 04/28/2011 [facility] Patient Information 2015 [place], [place]. This information is not intended to replace advice given to you by your health care provider. Make sure you discuss any questions you have with your health care provider. Head Injury You have received a head injury. It does not appear serious at this time. Headaches and vomiting are common following head injury. It should be easy to awaken from sleeping. Sometimes it is necessary for you to stay in the emergency department for a while for observation. Sometimes admission to the hospital may be needed. After injuries such as yours, most problems occur within the  first 24 hours, but side effects may occur up to 7-10 days after the injury. It is important for you to carefully monitor your condition and contact your health care provider or seek immediate medical care if there is a change in your condition. WHAT ARE THE TYPES OF HEAD INJURIES? Head injuries can be as minor as a bump.  Some head injuries can be more severe. More severe head injuries include:  A jarring injury to the brain (concussion).  A bruise of the brain (contusion). This mean there is bleeding in the brain that can cause swelling.  A cracked skull (skull fracture).  Bleeding in the brain that collects, clots, and forms a bump (hematoma). WHAT CAUSES A HEAD INJURY? A serious head injury is most likely to happen to someone who is in a car wreck and is not wearing a seat belt. Other causes of major head injuries include bicycle or motorcycle accidents, sports injuries, and falls. HOW ARE HEAD INJURIES DIAGNOSED? A complete history of the event leading to the injury and your current symptoms will be helpful in diagnosing head injuries. Many times, pictures of the brain, such as CT or MRI are needed to see the extent of the injury. Often, an overnight hospital stay is necessary for observation.  WHEN SHOULD I SEEK IMMEDIATE MEDICAL CARE?  You should get help right away if:  You have confusion or drowsiness.  You feel sick to your stomach (nauseous) or have continued, forceful vomiting.  You have dizziness or unsteadiness that is getting worse.  You have severe, continued headaches not relieved by medicine. Only take over-the-counter or prescription medicines for pain, fever, or discomfort as directed by your health care provider.  You do not have normal function of the arms or legs or are unable to walk.  You notice changes in the black spots in the center of the colored part of your eye (pupil).  You have a clear or bloody fluid coming from your nose or ears.  You have a loss of vision. During the next 24 hours after the injury, you must stay with someone who can watch you for the warning signs. This person should contact local emergency services (911 in the [place] if you have seizures, you become unconscious, or you are unable to wake up. HOW CAN I PREVENT A HEAD INJURY IN THE FUTURE? The most  important factor for preventing major head injuries is avoiding motor vehicle accidents. To minimize the potential for damage to your head, it is crucial to wear seat belts while riding in motor vehicles. Wearing helmets while bike riding and playing collision sports (like football) is also helpful. Also, avoiding dangerous activities around the house will further help reduce your risk of head injury.  WHEN CAN I RETURN TO NORMAL ACTIVITIES AND ATHLETICS? You should be reevaluated by your health care provider before returning to these activities. If you have any of the following symptoms, you should not return to activities or contact sports until 1 week after the symptoms have stopped:  Persistent headache.  Dizziness or vertigo.  Poor attention and concentration.  Confusion.  Memory problems.  Nausea or vomiting.  Fatigue or tire easily.  Irritability.  Intolerant of bright lights or loud noises.  Anxiety or depression.  Disturbed sleep. MAKE SURE YOU:   Understand these instructions.  Will watch your condition.  Will get help right away if you are not doing well or get worse. Document Released: 03/20/2005 Document Revised: 03/25/2013 Document Reviewed: 11/25/2012 [employer]  Patient Information ©2015 [employer]. This information is not intended to replace advice given to you by your health care provider. Make sure you discuss any questions you have with your health care provider. ° °

## 2013-11-07 LAB — URINE CULTURE: Colony Count: >=100000

## 2013-11-08 ENCOUNTER — Telehealth (HOSPITAL_BASED_OUTPATIENT_CLINIC_OR_DEPARTMENT_OTHER): Payer: Self-pay | Admitting: Emergency Medicine

## 2013-11-08 NOTE — Telephone Encounter (Signed)
Post ED Visit - Positive Culture Follow-up  Culture report reviewed by antimicrobial stewardship pharmacist: []  Wes Dulaney, Pharm.D., BCPS []  Celedonio MiyamotoJeremy Frens, [address] BCPS []  Georgina PillionElizabeth Martin, Pharm.D., BCPS []  NorthlakeMinh Pham, [address]Ph[address] BCPS, AAHIVP [x]  Estella HuskMichelle Turner, Pharm.D., BCPS, AAHIVP []  Red ChristiansSamson Lee, Pharm.D. []  Tennis Mustassie Stewart, Pharm.D.  Positive urine culture Treated with Keflex, organism sensitive to the same and no further patient follow-up is required at this time.  Marcelle OverlieHolland, Jenel LucksKylie 11/08/2013, 10:25 AM

## 2014-03-06 ENCOUNTER — Emergency Department (HOSPITAL_BASED_OUTPATIENT_CLINIC_OR_DEPARTMENT_OTHER)
Admission: EM | Admit: 2014-03-06 | Discharge: 2014-03-06 | Disposition: A | Discharge status: Home / Self Care | Attending: Emergency Medicine | Admitting: Emergency Medicine

## 2014-03-06 ENCOUNTER — Emergency Department (HOSPITAL_BASED_OUTPATIENT_CLINIC_OR_DEPARTMENT_OTHER)

## 2014-03-06 ENCOUNTER — Encounter (HOSPITAL_BASED_OUTPATIENT_CLINIC_OR_DEPARTMENT_OTHER): Payer: Self-pay

## 2014-03-06 DIAGNOSIS — Y9389 Activity, other specified: Secondary | ICD-10-CM | POA: Diagnosis not present

## 2014-03-06 DIAGNOSIS — Z792 Long term (current) use of antibiotics: Secondary | ICD-10-CM | POA: Diagnosis not present

## 2014-03-06 DIAGNOSIS — R1310 Dysphagia, unspecified: Secondary | ICD-10-CM | POA: Insufficient documentation

## 2014-03-06 DIAGNOSIS — S0083XA Contusion of other part of head, initial encounter: Secondary | ICD-10-CM | POA: Diagnosis not present

## 2014-03-06 DIAGNOSIS — Z8669 Personal history of other diseases of the nervous system and sense organs: Secondary | ICD-10-CM | POA: Diagnosis not present

## 2014-03-06 DIAGNOSIS — E539 Vitamin B deficiency, unspecified: Secondary | ICD-10-CM | POA: Insufficient documentation

## 2014-03-06 DIAGNOSIS — F0390 Unspecified dementia without behavioral disturbance: Secondary | ICD-10-CM | POA: Diagnosis not present

## 2014-03-06 DIAGNOSIS — W01190A Fall on same level from slipping, tripping and stumbling with subsequent striking against furniture, initial encounter: Secondary | ICD-10-CM | POA: Diagnosis not present

## 2014-03-06 DIAGNOSIS — Y9289 Other specified places as the place of occurrence of the external cause: Secondary | ICD-10-CM | POA: Diagnosis not present

## 2014-03-06 DIAGNOSIS — Z8619 Personal history of other infectious and parasitic diseases: Secondary | ICD-10-CM | POA: Insufficient documentation

## 2014-03-06 DIAGNOSIS — T148XXA Other injury of unspecified body region, initial encounter: Secondary | ICD-10-CM

## 2014-03-06 DIAGNOSIS — Z87448 Personal history of other diseases of urinary system: Secondary | ICD-10-CM | POA: Insufficient documentation

## 2014-03-06 DIAGNOSIS — I10 Essential (primary) hypertension: Secondary | ICD-10-CM | POA: Diagnosis not present

## 2014-03-06 DIAGNOSIS — Y998 Other external cause status: Secondary | ICD-10-CM | POA: Insufficient documentation

## 2014-03-06 DIAGNOSIS — S0011XA Contusion of right eyelid and periocular area, initial encounter: Secondary | ICD-10-CM | POA: Diagnosis not present

## 2014-03-06 DIAGNOSIS — D649 Anemia, unspecified: Secondary | ICD-10-CM | POA: Insufficient documentation

## 2014-03-06 DIAGNOSIS — S0990XA Unspecified injury of head, initial encounter: Secondary | ICD-10-CM | POA: Diagnosis present

## 2014-03-06 DIAGNOSIS — Z79899 Other long term (current) drug therapy: Secondary | ICD-10-CM | POA: Diagnosis not present

## 2014-03-06 DIAGNOSIS — Z8701 Personal history of pneumonia (recurrent): Secondary | ICD-10-CM | POA: Insufficient documentation

## 2014-03-06 NOTE — Discharge Instructions (Signed)
CT of head and face repeated. The bump on the right forehead areas consistent with a subcutaneous hematoma. No specific treatment required. If it does un roof it should be consistent with the old blood. No evidence of any skull fracture or acute brain injury.

## 2014-03-06 NOTE — ED Provider Notes (Signed)
CSN: [id number]     Arrival date & time 03/06/14  1628 History   First MD Initiated Contact with Patient 03/06/14 1644     Chief Complaint  Patient presents with  . Head Injury     (Consider location/radiation/quality/duration/timing/severity/associated sxs/prior Treatment) Patient is a 80 y.o. female presenting with head injury. The history is provided by the EMS personnel and the nursing home. The history is limited by the condition of the patient.  Head Injury  level V caveat applies due to her history of dementia. Patient status post fall one week ago hit head on furniture's had a bump on her right forehead since [place] today. Patient also is significant bruising on the right side of her face. Patient not able to relate any of the history. Patient referred in from nursing home.  Past Medical History  Diagnosis Date  . Depressed   . Hypertension   . Dementia     with psychosis  . Insomnia   . Anemia   . Mood disorder   . Uterine prolapse   . Vaginal prolapse   . Cystocele   . Incontinence   . Dysphagia     pureed diet  . Prediabetes   . Tinea cruris   . Weight loss     03/2011  . Vitamin B deficiency   . Right thyroid nodule   . Pneumonia     10/2010  . Dementia   . HTN (hypertension)   . Depression    History reviewed. No pertinent past surgical history. No family history on file. History  Substance Use Topics  . Smoking status: Unknown If Ever Smoked  . Smokeless tobacco: Not on file  . Alcohol Use: No   OB History    No data available     Review of Systems  Unable to perform ROS  level V caveat applies to dementia.    Allergies  Chlorethamine and Thorazine  Home Medications   Prior to Admission medications   Medication Sig Start Date End Date Taking? Authorizing Provider  antiseptic oral rinse (BIOTENE) LIQD 15 mLs by Mouth Rinse route 2 times daily at 12 noon and 4 pm. 08/28/13   Stephani Police, PA-C  cefUROXime (CEFTIN) 500 MG tablet Take 1  tablet (500 mg total) by mouth 2 (two) times daily with a meal. 08/28/13   Tora Kindred York, PA-C  cephALEXin (KEFLEX) 500 MG capsule Take 1 capsule (500 mg total) by mouth 2 (two) times daily. 11/03/13   Kristen N Ward, DO  cetirizine (ZYRTEC) 10 MG tablet Take 10 mg by mouth at bedtime.    Historical Provider, MD  cholecalciferol (VITAMIN D) 1000 UNITS tablet Take 1,000 Units by mouth daily.      Historical Provider, MD  donepezil (ARICEPT) 10 MG tablet Take 10 mg by mouth at bedtime.    Historical Provider, MD  doxycycline (VIBRAMYCIN) 50 MG capsule Take 1 capsule (50 mg total) by mouth 2 (two) times daily. 08/28/13   Stephani Police, PA-C  ferrous sulfate 325 (65 FE) MG tablet Take 325 mg by mouth daily with breakfast.    Historical Provider, MD  food thickener (THICK IT) POWD Take 1 g by mouth as needed (dysphagia). 08/28/13   Stephani Police, PA-C  HYDROcodone-acetaminophen (NORCO/VICODIN) 5-325 MG per tablet Take 1 tablet by mouth every 4 (four) hours as needed (pain). 08/28/13   Tora Kindred York, PA-C  lisinopril (PRINIVIL,ZESTRIL) 5 MG tablet Take 1 tablet (5 mg total) by mouth  daily. 08/28/13   Stephani PoliceMarianne L York, PA-C  Memantine HCl ER (NAMENDA XR) 14 MG CP24 Take 14 mg by mouth at bedtime.     Historical Provider, MD  Multiple Vitamin (MULTIVITAMIN WITH MINERALS) TABS tablet Take 1 tablet by mouth daily.    Historical Provider, MD  omeprazole (PRILOSEC) 20 MG capsule Take 20 mg by mouth daily.    Historical Provider, MD  Oxcarbazepine (TRILEPTAL) 300 MG tablet Take 300 mg by mouth 2 (two) times daily.    Historical Provider, MD  Skin Protectants, Misc. (BAZA PROTECT EX) Apply 1 application topically See admin instructions. Apply to buttocks three times daily and as needed after each incontinent episode    Historical Provider, MD  vitamin B-12 (CYANOCOBALAMIN) 1000 MCG tablet Take 1,000 mcg by mouth daily.    Historical Provider, MD  vitamin C (ASCORBIC ACID) 500 MG tablet Take 500 mg by mouth daily.     Historical Provider, MD   BP 138/61 mmHg  Pulse 55  Temp(Src) 97.7 F (36.5 C) (Oral)  Resp 16  Ht 5\' 3"  (1.6 m)  Wt 145 lb (65.772 kg)  BMI 25.69 kg/m2  SpO2 100% Physical Exam  Constitutional: She appears well-developed and well-nourished. No distress.  HENT:  2 x 2 centimeter hematoma right forehead area. With a scab on the surface. No active bleeding. Bruising around the right eye. Sclera clear no hyphema.  Eyes: Conjunctivae and EOM are normal. Pupils are equal, round, and reactive to light.  Neck: Neck supple.  Cardiovascular: Normal rate and normal heart sounds.   Pulmonary/Chest: Effort normal and breath sounds normal. No respiratory distress.  Abdominal: Soft. Bowel sounds are normal. There is no tenderness.  Musculoskeletal: Normal range of motion.  Neurological: She is alert. No cranial nerve deficit. She exhibits normal muscle tone. Coordination normal.  Skin: Skin is warm. No rash noted.  Vitals reviewed.   ED Course  Procedures (including critical care time) Labs Review Labs Reviewed - No data to display  Imaging Review Ct Head Wo Contrast  03/06/2014   CLINICAL DATA:  Fall, knot on right forehead, confusion  EXAM: CT HEAD WITHOUT CONTRAST  CT MAXILLOFACIAL WITHOUT CONTRAST  TECHNIQUE: Multidetector CT imaging of the head and maxillofacial structures were performed using the standard protocol without intravenous contrast. Multiplanar CT image reconstructions of the maxillofacial structures were also generated.  COMPARISON:  CT head/cervical spine dated 02/24/2014  FINDINGS: CT HEAD FINDINGS  Motion degraded images.  No evidence of parenchymal hemorrhage or extra-axial fluid collection. No mass lesion, mass effect, or midline shift.  No CT evidence of acute infarction. Old lacunar infarct in the pons (series 4/image 1).  Extensive small vessel ischemic changes. Intracranial atherosclerosis.  Global cortical atrophy.  No ventriculomegaly.  The visualized paranasal  sinuses are essentially clear. The mastoid air cells are unopacified.  1.3 x 2.1 cm extracranial hematoma overlying the right frontal bone (series 4/image 6).  No evidence of calvarial fracture.  CT MAXILLOFACIAL FINDINGS  Motion degraded images.  No evidence of maxillofacial fracture.  The bilateral orbits, including the globes and retroconal soft tissues, are within normal limits.  Mandible is intact. Bilateral mandibular condyles are well-seated in the TMJs.  The visualized paranasal sinuses are essentially clear. The mastoid air cells are unopacified.  Visualized cervical spine is intact to C5-6, noting mild to moderate degenerative changes.  IMPRESSION: Motion degraded images.  1.3 x 2.1 cm extracranial hematoma overlying the right frontal bone. No evidence of calvarial fracture. No evidence of  acute intracranial abnormality.  No evidence of maxillofacial fracture.   Electronically Signed   By: Charline BillsSriyesh  Krishnan M.D.   On: 03/06/2014 18:58   Ct Maxillofacial Wo Cm  03/06/2014   CLINICAL DATA:  Fall, knot on right forehead, confusion  EXAM: CT HEAD WITHOUT CONTRAST  CT MAXILLOFACIAL WITHOUT CONTRAST  TECHNIQUE: Multidetector CT imaging of the head and maxillofacial structures were performed using the standard protocol without intravenous contrast. Multiplanar CT image reconstructions of the maxillofacial structures were also generated.  COMPARISON:  CT head/cervical spine dated 02/24/2014  FINDINGS: CT HEAD FINDINGS  Motion degraded images.  No evidence of parenchymal hemorrhage or extra-axial fluid collection. No mass lesion, mass effect, or midline shift.  No CT evidence of acute infarction. Old lacunar infarct in the pons (series 4/image 1).  Extensive small vessel ischemic changes. Intracranial atherosclerosis.  Global cortical atrophy.  No ventriculomegaly.  The visualized paranasal sinuses are essentially clear. The mastoid air cells are unopacified.  1.3 x 2.1 cm extracranial hematoma overlying the  right frontal bone (series 4/image 6).  No evidence of calvarial fracture.  CT MAXILLOFACIAL FINDINGS  Motion degraded images.  No evidence of maxillofacial fracture.  The bilateral orbits, including the globes and retroconal soft tissues, are within normal limits.  Mandible is intact. Bilateral mandibular condyles are well-seated in the TMJs.  The visualized paranasal sinuses are essentially clear. The mastoid air cells are unopacified.  Visualized cervical spine is intact to C5-6, noting mild to moderate degenerative changes.  IMPRESSION: Motion degraded images.  1.3 x 2.1 cm extracranial hematoma overlying the right frontal bone. No evidence of calvarial fracture. No evidence of acute intracranial abnormality.  No evidence of maxillofacial fracture.   Electronically Signed   By: Charline BillsSriyesh  Krishnan M.D.   On: 03/06/2014 18:58     EKG Interpretation None      MDM   Final diagnoses:  Head injury  Hematoma    Head CT must been done at other location. [facility] recent [facility] to [facility] Repeat head CT consistent with right forehead hematoma. No skull fracture no brain injury. No specific treatment required for the hematoma. CT of maxillofacial without any bony facial injuries. Patient stable for discharge back to nursing facility.    Vanetta MuldersScott Aquil Duhe, MD 03/06/14 [phone]

## 2014-03-06 NOTE — ED Notes (Signed)
Pt brought in by [facility]. [facility] [address] Pt fell 1 week ago, hit head on furniture with hematoma to right forehead. Facility now sts swelling to hematoma and increased pain. Old bruising noted to right side of face. VSS stable  152/78, 60.  CBG 145

## 2014-03-08 ENCOUNTER — Encounter (HOSPITAL_COMMUNITY): Payer: Self-pay | Admitting: *Deleted

## 2014-03-08 ENCOUNTER — Emergency Department (HOSPITAL_COMMUNITY)
Admission: EM | Admit: 2014-03-08 | Discharge: 2014-03-08 | Disposition: A | Discharge status: Skilled Nursing Facility | Payer: Medicare Other | Attending: Emergency Medicine | Admitting: Emergency Medicine

## 2014-03-08 ENCOUNTER — Emergency Department (HOSPITAL_COMMUNITY): Payer: Medicare Other

## 2014-03-08 DIAGNOSIS — F0390 Unspecified dementia without behavioral disturbance: Secondary | ICD-10-CM | POA: Insufficient documentation

## 2014-03-08 DIAGNOSIS — W19XXXA Unspecified fall, initial encounter: Secondary | ICD-10-CM

## 2014-03-08 DIAGNOSIS — D649 Anemia, unspecified: Secondary | ICD-10-CM | POA: Diagnosis not present

## 2014-03-08 DIAGNOSIS — W1839XA Other fall on same level, initial encounter: Secondary | ICD-10-CM | POA: Diagnosis not present

## 2014-03-08 DIAGNOSIS — Z87448 Personal history of other diseases of urinary system: Secondary | ICD-10-CM | POA: Diagnosis not present

## 2014-03-08 DIAGNOSIS — Z8701 Personal history of pneumonia (recurrent): Secondary | ICD-10-CM | POA: Insufficient documentation

## 2014-03-08 DIAGNOSIS — I10 Essential (primary) hypertension: Secondary | ICD-10-CM | POA: Diagnosis not present

## 2014-03-08 DIAGNOSIS — Z79899 Other long term (current) drug therapy: Secondary | ICD-10-CM | POA: Insufficient documentation

## 2014-03-08 DIAGNOSIS — Y9389 Activity, other specified: Secondary | ICD-10-CM | POA: Insufficient documentation

## 2014-03-08 DIAGNOSIS — Y92129 Unspecified place in nursing home as the place of occurrence of the external cause: Secondary | ICD-10-CM | POA: Insufficient documentation

## 2014-03-08 DIAGNOSIS — Z8619 Personal history of other infectious and parasitic diseases: Secondary | ICD-10-CM | POA: Insufficient documentation

## 2014-03-08 DIAGNOSIS — S0990XA Unspecified injury of head, initial encounter: Secondary | ICD-10-CM | POA: Diagnosis present

## 2014-03-08 DIAGNOSIS — S0181XA Laceration without foreign body of other part of head, initial encounter: Secondary | ICD-10-CM | POA: Diagnosis not present

## 2014-03-08 DIAGNOSIS — Y998 Other external cause status: Secondary | ICD-10-CM | POA: Insufficient documentation

## 2014-03-08 DIAGNOSIS — E539 Vitamin B deficiency, unspecified: Secondary | ICD-10-CM | POA: Diagnosis not present

## 2014-03-08 DIAGNOSIS — S0003XA Contusion of scalp, initial encounter: Secondary | ICD-10-CM

## 2014-03-08 DIAGNOSIS — F39 Unspecified mood [affective] disorder: Secondary | ICD-10-CM | POA: Insufficient documentation

## 2014-03-08 LAB — BASIC METABOLIC PANEL
ANION GAP: 15 (ref 5–15)
BUN: 29 mg/dL — ABNORMAL HIGH (ref 6–23)
CHLORIDE: 103 meq/L (ref 96–112)
CO2: 22 meq/L (ref 19–32)
Calcium: 9.2 mg/dL (ref 8.4–10.5)
Creatinine, Ser: 0.93 mg/dL (ref 0.50–1.10)
GFR calc Af Amer: 62 mL/min — ABNORMAL LOW (ref >90)
GFR calc non Af Amer: 53 mL/min — ABNORMAL LOW (ref >90)
GLUCOSE: 151 mg/dL — AB (ref 70–99)
POTASSIUM: 4 meq/L (ref 3.7–5.3)
Sodium: 140 mEq/L (ref 137–147)

## 2014-03-08 LAB — CBC WITH DIFFERENTIAL/PLATELET
BASOS ABS: 0 10*3/uL (ref 0.0–0.1)
Basophils Relative: 0 % (ref 0–1)
EOS PCT: 2 % (ref 0–5)
Eosinophils Absolute: 0.1 10*3/uL (ref 0.0–0.7)
HCT: 26.2 % — ABNORMAL LOW (ref 36.0–46.0)
Hemoglobin: 8.5 g/dL — ABNORMAL LOW (ref 12.0–15.0)
LYMPHS ABS: 1.6 10*3/uL (ref 0.7–4.0)
LYMPHS PCT: 27 % (ref 12–46)
MCH: 30.9 pg (ref 26.0–34.0)
MCHC: 32.4 g/dL (ref 30.0–36.0)
MCV: 95.3 fL (ref 78.0–100.0)
Monocytes Absolute: 0.4 10*3/uL (ref 0.1–1.0)
Monocytes Relative: 6 % (ref 3–12)
Neutro Abs: 3.9 10*3/uL (ref 1.7–7.7)
Neutrophils Relative %: 65 % (ref 43–77)
Platelets: 179 10*3/uL (ref 150–400)
RBC: 2.75 MIL/uL — AB (ref 3.87–5.11)
RDW: 13.2 % (ref 11.5–15.5)
WBC: 6 10*3/uL (ref 4.0–10.5)

## 2014-03-08 NOTE — ED Notes (Signed)
PA at bedside to pressure dsg to pt head. Pt tolerated well.

## 2014-03-08 NOTE — Discharge Instructions (Signed)
Suture removal in a week.   Keep dressing on.   Follow up with your doctor.   Return to ER if you have more bleeding, passing out.

## 2014-03-08 NOTE — ED Notes (Signed)
Large amount of bleeding noted above right eyebrow--large hematoma noted Large amount of bruising in various stages of healing noted to right orbital area which radiates down towards the right side of the neck Per EMS, patient with hx of falls Patient with hx of dementia--unable to verbalized today's events, continues to scream

## 2014-03-08 NOTE — ED Notes (Signed)
Awake. Verbally responsive. A/O x1 self. Resp even and unlabored. No audible adventitious breath sounds noted. ABC's intact. PA at bedside to apply pressure dsg. Pt tolerated well.

## 2014-03-08 NOTE — ED Notes (Signed)
Per EMS: [facility].  Pt fell last week and "scratched a scab" on her head while eating.  Now has a wound to rt forehead that is squirting blood forcefully across room.  Dr. Silverio LayYao called to room.

## 2014-03-08 NOTE — ED Notes (Signed)
Patient transported to CT 

## 2014-03-08 NOTE — ED Notes (Signed)
[place] and [place], med-tech and she reported that the facility will not be able to provide transportation and pt must be transported via EMS.

## 2014-03-08 NOTE — ED Notes (Signed)
Bed: RESB Expected date:  Expected time:  Means of arrival:  Comments: EMS--Old lady squirting head

## 2014-03-08 NOTE — ED Notes (Signed)
Called communication and inform of requesting transportation for pt to ALF Margaret Mcgr[facility]).

## 2014-03-08 NOTE — ED Notes (Signed)
Bleeding stopped after sutures applied by Dr. Silverio LayYao

## 2014-03-08 NOTE — ED Notes (Signed)
Patient returned from CT scan.

## 2014-03-08 NOTE — ED Provider Notes (Addendum)
CSN: [id number]     Arrival date & time 03/08/14  1848 History   First MD Initiated Contact with Patient 03/08/14 1859     Chief Complaint  Patient presents with  . Head Injury     (Consider location/radiation/quality/duration/timing/severity/associated sxs/prior Treatment) The history is provided by the patient.  Margaret Mcgrath is a 80 y.o. female hx of depression, dementia with psychosis, here with right temporal bleeding. She had head injury 2 days ago was seen in the ER and had a CT that showed scalp hematoma. She had a scab there that she was picking at it today. Around dinnertime she removed the scab and there was profuse bleeding from the hematoma. She resides at a nursing home. No reported falls since 2 days ago but EMS was unsure.   Level V caveat- dementia    Past Medical History  Diagnosis Date  . Depressed   . Hypertension   . Dementia     with psychosis  . Insomnia   . Anemia   . Mood disorder   . Uterine prolapse   . Vaginal prolapse   . Cystocele   . Incontinence   . Dysphagia     pureed diet  . Prediabetes   . Tinea cruris   . Weight loss     03/2011  . Vitamin B deficiency   . Right thyroid nodule   . Pneumonia     10/2010  . Dementia   . HTN (hypertension)   . Depression    No past surgical history on file. No family history on file. History  Substance Use Topics  . Smoking status: Unknown If Ever Smoked  . Smokeless tobacco: Not on file  . Alcohol Use: No   OB History    No data available     Review of Systems  Skin: Positive for wound.  All other systems reviewed and are negative.     Allergies  Chlorethamine and Thorazine  Home Medications   Prior to Admission medications   Medication Sig Start Date End Date Taking? Authorizing Provider  antiseptic oral rinse (BIOTENE) LIQD 15 mLs by Mouth Rinse route 2 times daily at 12 noon and 4 pm. 08/28/13  Yes Tora KindredMarianne L York, PA-C  cetirizine (ZYRTEC) 10 MG tablet Take 10 mg by mouth  at bedtime.   Yes Historical Provider, MD  cholecalciferol (VITAMIN D) 1000 UNITS tablet Take 1,000 Units by mouth daily.     Yes Historical Provider, MD  donepezil (ARICEPT) 10 MG tablet Take 10 mg by mouth at bedtime.   Yes Historical Provider, MD  ferrous sulfate 325 (65 FE) MG tablet Take 325 mg by mouth daily with breakfast.   Yes Historical Provider, MD  HYDROcodone-acetaminophen (NORCO/VICODIN) 5-325 MG per tablet Take 1 tablet by mouth every 4 (four) hours as needed (pain). 08/28/13  Yes Marianne L York, PA-C  lisinopril (PRINIVIL,ZESTRIL) 5 MG tablet Take 1 tablet (5 mg total) by mouth daily. 08/28/13  Yes Tora KindredMarianne L York, PA-C  Memantine HCl ER (NAMENDA XR) 14 MG CP24 Take 14 mg by mouth at bedtime.    Yes Historical Provider, MD  Multiple Vitamins-Minerals (THEREMS M PO) Take 1 tablet by mouth daily. Therems-M 27-0.4mg    Yes Historical Provider, MD  omeprazole (PRILOSEC) 20 MG capsule Take 20 mg by mouth daily.   Yes Historical Provider, MD  OXcarbazepine (TRILEPTAL) 150 MG tablet Take 150 mg by mouth 2 (two) times daily.   Yes Historical Provider, MD  Skin Protectants, Misc. (  BAZA PROTECT EX) Apply 1 application topically See admin instructions. Apply to buttocks three times daily and as needed after each incontinent episode   Yes Historical Provider, MD  traMADol (ULTRAM) 50 MG tablet Take 50 mg by mouth every 6 (six) hours as needed for moderate pain or severe pain.   Yes Historical Provider, MD  vitamin B-12 (CYANOCOBALAMIN) 1000 MCG tablet Take 1,000 mcg by mouth daily.   Yes Historical Provider, MD  vitamin C (ASCORBIC ACID) 500 MG tablet Take 500 mg by mouth daily.   Yes Historical Provider, MD  cefUROXime (CEFTIN) 500 MG tablet Take 1 tablet (500 mg total) by mouth 2 (two) times daily with a meal. Patient not taking: Reported on 03/08/2014 08/28/13   Tora KindredMarianne L York, PA-C  cephALEXin (KEFLEX) 500 MG capsule Take 1 capsule (500 mg total) by mouth 2 (two) times daily. Patient not  taking: Reported on 03/08/2014 11/03/13   Layla MawKristen N Ward, DO  doxycycline (VIBRAMYCIN) 50 MG capsule Take 1 capsule (50 mg total) by mouth 2 (two) times daily. Patient not taking: Reported on 03/08/2014 08/28/13   Clerance LavMarianne L York, PA-C   BP 155/82 mmHg  Pulse 70  Resp 20  SpO2 100% Physical Exam  Constitutional:  Demented, agitated, bleeding R temple area   HENT:  Mouth/Throat: Oropharynx is clear and moist.  Active bleeding R temple area. Atraumatic head otherwise   Eyes: Conjunctivae are normal. Pupils are equal, round, and reactive to light.  Neck: Normal range of motion. Neck supple.  Cardiovascular: Normal rate, regular rhythm and normal heart sounds.   Pulmonary/Chest: Effort normal and breath sounds normal. No respiratory distress. She has no wheezes. She has no rales.  Abdominal: Soft. Bowel sounds are normal. She exhibits no distension. There is no tenderness. There is no rebound.  Musculoskeletal: Normal range of motion. She exhibits no edema.  Neurological: She is alert.  Demented, moving all extremities   Skin: Skin is warm and dry.  Psychiatric:  Unable   Nursing note and vitals reviewed.   ED Course  Procedures (including critical care time)  LACERATION REPAIR Performed by: Chaney MallingYAO, DAVID Authorized by: Chaney MallingYAO, DAVID Consent: Verbal consent obtained. Risks and benefits: risks, benefits and alternatives were discussed Consent given by: patient Patient identity confirmed: provided demographic data Prepped and Draped in normal sterile fashion Wound explored  Laceration Location: L temporal area  Laceration Length:  2 cm  No Foreign Bodies seen or palpated  Anesthesia: local infiltration  Local anesthetic: lidocaine 2 % no epinephrine  Anesthetic total: 3 ml  Irrigation method: syringe Amount of cleaning: standard  Skin closure: figure of 8 stitches  Number of sutures: 4  Technique: I did 3 4-0 stiches and had to place 1 2-0 stitch to control bleeding.    Patient tolerance: Patient tolerated the procedure well with no immediate complications.  Labs Review Labs Reviewed  CBC WITH DIFFERENTIAL - Abnormal; Notable for the following:    RBC 2.75 (*)    Hemoglobin 8.5 (*)    HCT 26.2 (*)    All other components within normal limits  BASIC METABOLIC PANEL - Abnormal; Notable for the following:    Glucose, Bld 151 (*)    BUN 29 (*)    GFR calc non Af Amer 53 (*)    GFR calc Af Amer 62 (*)    All other components within normal limits    Imaging Review Ct Head Wo Contrast  03/08/2014   CLINICAL DATA:  Larey SeatFell last week,  RIGHT forehead wound now bleeding, history hypertension, dementia  EXAM: CT HEAD WITHOUT CONTRAST  TECHNIQUE: Contiguous axial images were obtained from the base of the skull through the vertex without intravenous contrast.  COMPARISON:  03/06/2014  FINDINGS: Generalized atrophy.  Normal ventricular morphology.  No midline shift or mass effect.  Old pontine lacunar infarcts.  Old RIGHT cerebellar infarct.  Probable small old LEFT occipital infarct.  No intracranial hemorrhage, mass lesion, or evidence acute infarction.  Small vessel chronic ischemic changes of deep cerebral white matter.  No extra-axial fluid collections.  Atherosclerotic calcifications of vertebral arteries and of the carotid siphons.  Slight decrease in size of RIGHT frontal scalp hematoma previously 13 mm thick, now 9 mm.  IMPRESSION: RIGHT frontal scalp hematoma, minimally decreased.  Atrophy with small vessel chronic ischemic changes of deep cerebral white matter.  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  No acute intracranial abnormalities.   Electronically Signed   By: Ulyses Southward M.D.   On: 03/08/2014 19:42     EKG Interpretation None      MDM   Final diagnoses:  Fall    Margaret Mcgrath is a 80 y.o. female here with bleeding from scalp hematoma. I was able to do figure of 8 stiches and was able to control the  bleeding. Will check CBC. Unclear if she has new head injury since 2 days ago. Will repeat CT head.   8:26 PM CT head showed stable hematoma. Hg dropped to 8.5 from 9.4, likely from bleeding that is stopped. Hemodynamically stable. Don't need blood right now. Will d/c back to nursing home.     Richardean Canal, MD 03/08/14 2029  Richardean Canal, MD 03/09/14 [phone]

## 2014-03-08 NOTE — ED Notes (Signed)
Awake. Verbally responsive. A/O x1. Resp even and unlabored. No audible adventitious breath sounds noted. ABC's intact. Dsg dry and intact to head.

## 2014-03-25 ENCOUNTER — Encounter (HOSPITAL_BASED_OUTPATIENT_CLINIC_OR_DEPARTMENT_OTHER): Payer: Self-pay | Admitting: *Deleted

## 2014-03-25 ENCOUNTER — Emergency Department (HOSPITAL_BASED_OUTPATIENT_CLINIC_OR_DEPARTMENT_OTHER)
Admission: EM | Admit: 2014-03-25 | Discharge: 2014-03-25 | Disposition: A | Discharge status: Home / Self Care | Payer: Medicare Other | Attending: Emergency Medicine | Admitting: Emergency Medicine

## 2014-03-25 DIAGNOSIS — Z79899 Other long term (current) drug therapy: Secondary | ICD-10-CM | POA: Diagnosis not present

## 2014-03-25 DIAGNOSIS — L0201 Cutaneous abscess of face: Secondary | ICD-10-CM

## 2014-03-25 DIAGNOSIS — F329 Major depressive disorder, single episode, unspecified: Secondary | ICD-10-CM | POA: Insufficient documentation

## 2014-03-25 DIAGNOSIS — F0390 Unspecified dementia without behavioral disturbance: Secondary | ICD-10-CM | POA: Insufficient documentation

## 2014-03-25 DIAGNOSIS — I10 Essential (primary) hypertension: Secondary | ICD-10-CM | POA: Diagnosis not present

## 2014-03-25 DIAGNOSIS — D649 Anemia, unspecified: Secondary | ICD-10-CM | POA: Insufficient documentation

## 2014-03-25 DIAGNOSIS — Z792 Long term (current) use of antibiotics: Secondary | ICD-10-CM | POA: Diagnosis not present

## 2014-03-25 DIAGNOSIS — Z8701 Personal history of pneumonia (recurrent): Secondary | ICD-10-CM | POA: Diagnosis not present

## 2014-03-25 DIAGNOSIS — Z8619 Personal history of other infectious and parasitic diseases: Secondary | ICD-10-CM | POA: Insufficient documentation

## 2014-03-25 DIAGNOSIS — Z8742 Personal history of other diseases of the female genital tract: Secondary | ICD-10-CM | POA: Diagnosis not present

## 2014-03-25 DIAGNOSIS — E539 Vitamin B deficiency, unspecified: Secondary | ICD-10-CM | POA: Diagnosis not present

## 2014-03-25 MED ORDER — CLINDAMYCIN HCL 150 MG PO CAPS
450.0000 mg | ORAL_CAPSULE | Freq: Once | ORAL | Status: AC
  Administered 2014-03-25: 450 mg via ORAL
  Filled 2014-03-25: qty 3

## 2014-03-25 MED ORDER — LIDOCAINE HCL 2 % IJ SOLN
INTRAMUSCULAR | Status: AC
  Filled 2014-03-25: qty 20

## 2014-03-25 MED ORDER — LIDOCAINE HCL (PF) 2 % IJ SOLN
10.0000 mL | Freq: Once | INTRAMUSCULAR | Status: AC
  Administered 2014-03-25: 10 mL

## 2014-03-25 MED ORDER — LIDOCAINE HCL 2 % IJ SOLN: 20.0000 mL | Freq: Once | INTRAMUSCULAR | Status: DC

## 2014-03-25 MED ORDER — CLINDAMYCIN HCL 150 MG PO CAPS: 450.0000 mg | ORAL_CAPSULE | Freq: Four times a day (QID) | ORAL | Status: AC

## 2014-03-25 NOTE — ED Notes (Signed)
Report given to [facility] EMT for transport back to [facility]. Drsg with bacitracin placed on abscess to forehead with paper tape to secure.

## 2014-03-25 NOTE — ED Provider Notes (Signed)
CSN: [id number]     Arrival date & time 03/25/14  1617 History   First MD Initiated Contact with Patient 03/25/14 1648     Chief Complaint  Patient presents with  . Abscess     (Consider location/radiation/quality/duration/timing/severity/associated sxs/prior Treatment) HPI  80 year old female presents with concern for right forehead abscess. Patient had a fall at the end of November resulted in a hematoma. Came back about 2 weeks ago with bleeding from a hematoma that the patient had been removing the scab from. Had sutures placed at that time. Reports from EMS and is unknown if the sutures were removed. The patient has a hospice nurse that changes the dressing every 4 days. Today she was unable to remove the dressing and was sent to the ER for further evaluation. Unknown if she's had knee fevers. The patient is unable to provide any history due to her dementia.  Past Medical History  Diagnosis Date  . Depressed   . Hypertension   . Dementia     with psychosis  . Insomnia   . Anemia   . Mood disorder   . Uterine prolapse   . Vaginal prolapse   . Cystocele   . Incontinence   . Dysphagia     pureed diet  . Prediabetes   . Tinea cruris   . Weight loss     03/2011  . Vitamin B deficiency   . Right thyroid nodule   . Pneumonia     10/2010  . Dementia   . HTN (hypertension)   . Depression    History reviewed. No pertinent past surgical history. No family history on file. History  Substance Use Topics  . Smoking status: Unknown If Ever Smoked  . Smokeless tobacco: Not on file  . Alcohol Use: No   OB History    No data available     Review of Systems  Unable to perform ROS: Dementia      Allergies  Chlorethamine and Thorazine  Home Medications   Prior to Admission medications   Medication Sig Start Date End Date Taking? Authorizing Provider  antiseptic oral rinse (BIOTENE) LIQD 15 mLs by Mouth Rinse route 2 times daily at 12 noon and 4 pm. 08/28/13  Yes  Tora KindredMarianne L York, PA-C  cetirizine (ZYRTEC) 10 MG tablet Take 10 mg by mouth at bedtime.   Yes Historical Provider, MD  cholecalciferol (VITAMIN D) 1000 UNITS tablet Take 1,000 Units by mouth daily.     Yes Historical Provider, MD  donepezil (ARICEPT) 10 MG tablet Take 10 mg by mouth at bedtime.   Yes Historical Provider, MD  ferrous sulfate 325 (65 FE) MG tablet Take 325 mg by mouth daily with breakfast.   Yes Historical Provider, MD  lisinopril (PRINIVIL,ZESTRIL) 5 MG tablet Take 1 tablet (5 mg total) by mouth daily. 08/28/13  Yes Tora KindredMarianne L York, PA-C  Memantine HCl ER (NAMENDA XR) 14 MG CP24 Take 14 mg by mouth at bedtime.    Yes Historical Provider, MD  Multiple Vitamins-Minerals (THEREMS M PO) Take 1 tablet by mouth daily. Therems-M 27-0.4mg    Yes Historical Provider, MD  omeprazole (PRILOSEC) 20 MG capsule Take 20 mg by mouth daily.   Yes Historical Provider, MD  OXcarbazepine (TRILEPTAL) 150 MG tablet Take 150 mg by mouth 2 (two) times daily.   Yes Historical Provider, MD  vitamin B-12 (CYANOCOBALAMIN) 1000 MCG tablet Take 1,000 mcg by mouth daily.   Yes Historical Provider, MD  vitamin C (ASCORBIC ACID) 500  MG tablet Take 500 mg by mouth daily.   Yes Historical Provider, MD  cefUROXime (CEFTIN) 500 MG tablet Take 1 tablet (500 mg total) by mouth 2 (two) times daily with a meal. Patient not taking: Reported on 03/08/2014 08/28/13   Tora KindredMarianne L York, PA-C  cephALEXin (KEFLEX) 500 MG capsule Take 1 capsule (500 mg total) by mouth 2 (two) times daily. Patient not taking: Reported on 03/08/2014 11/03/13   Layla MawKristen N Ward, DO  doxycycline (VIBRAMYCIN) 50 MG capsule Take 1 capsule (50 mg total) by mouth 2 (two) times daily. Patient not taking: Reported on 03/08/2014 08/28/13   Stephani PoliceMarianne L York, PA-C  HYDROcodone-acetaminophen (NORCO/VICODIN) 5-325 MG per tablet Take 1 tablet by mouth every 4 (four) hours as needed (pain). 08/28/13   Stephani PoliceMarianne L York, PA-C  Skin Protectants, Misc. (BAZA PROTECT EX) Apply 1  application topically See admin instructions. Apply to buttocks three times daily and as needed after each incontinent episode    Historical Provider, MD  traMADol (ULTRAM) 50 MG tablet Take 50 mg by mouth every 6 (six) hours as needed for moderate pain or severe pain.    Historical Provider, MD   BP 131/54 mmHg  Pulse 58  Temp(Src) 97.6 F (36.4 C) (Oral)  Resp 18  Wt 145 lb (65.772 kg)  SpO2 100% Physical Exam  Constitutional: She appears well-developed and well-nourished.  HENT:  Head: Normocephalic.    Right Ear: External ear normal.  Left Ear: External ear normal.  Nose: Nose normal.  Eyes: Pupils are equal, round, and reactive to light. Right eye exhibits no discharge. Left eye exhibits no discharge.  Pulmonary/Chest: Effort normal.  Neurological: She is alert. She is disoriented.  Skin: Skin is warm and dry.  Nursing note and vitals reviewed.   ED Course  Procedures (including critical care time) Labs Review Labs Reviewed - No data to display  Imaging Review No results found.   EKG Interpretation None      MDM   Final diagnoses:  Abscess of forehead    Patient is at her normal mental baseline, no fevers or new altered mental status. The area on her for head is concerning for a wound infection/abscess. Apparently the gauze has not been change in 4 days and his doctor to her for head. After cleansing with saline I am slowly able to remove the gauze. There are some sutures but these are stuck and patient is unwilling to let me help take them out. I tried to give local anesthesia but was only able to anesthetize around one third of the area. Patient thrashes when trying to anesthetize and I feel it is a danger to try and go further to staff and myself at the bedside. I called her daughter, Len BlalockBonnie McCloud, and discussed options extensively. She does not want patient sedated given risks associated with this. I discussed possibilities of injuring staff and she this time  wants to try just antibiotics and better wound care at the facility. I was able to get a scalpel on the lateral aspect of the wound was unable to get any drainage out of it. Due to this, will treat with clindamycin and discussed strict return precautions that the daughter verbalized understanding.    Audree CamelScott T Navneet Schmuck, MD 03/26/14 [id number]

## 2014-03-25 NOTE — ED Notes (Signed)
[facility] arrived [facility] and had fall in November. Pt has wound to right forehead. Pt is hospice care at dementia care unit. Hospice nurse attempted to change drsg today and unable to get gauze out of wound and questions if wound needs to be drained. Pt's function has declined in past month according to staff at nsg home.

## 2014-03-25 NOTE — Discharge Instructions (Signed)
Patient needs daily or multiple times per day wound care to this right forehead abscess. If the antibiotics do not work or the abscess seems to grow or she develops fevers or more confusion than normal, it is important to come back to the hospital for further evaluation. See your doctor as soon as possible for close monitoring of the abscess.    Abscess An abscess is an infected area that contains a collection of pus and debris.It can occur in almost any part of the body. An abscess is also known as a furuncle or boil. CAUSES  An abscess occurs when tissue gets infected. This can occur from blockage of oil or sweat glands, infection of hair follicles, or a minor injury to the skin. As the body tries to fight the infection, pus collects in the area and creates pressure under the skin. This pressure causes pain. People with weakened immune systems have difficulty fighting infections and get certain abscesses more often.  SYMPTOMS Usually an abscess develops on the skin and becomes a painful mass that is red, warm, and tender. If the abscess forms under the skin, you may feel a moveable soft area under the skin. Some abscesses break open (rupture) on their own, but most will continue to get worse without care. The infection can spread deeper into the body and eventually into the bloodstream, causing you to feel ill.  DIAGNOSIS  Your caregiver will take your medical history and perform a physical exam. A sample of fluid may also be taken from the abscess to determine what is causing your infection. TREATMENT  Your caregiver may prescribe antibiotic medicines to fight the infection. However, taking antibiotics alone usually does not cure an abscess. Your caregiver may need to make a small cut (incision) in the abscess to drain the pus. In some cases, gauze is packed into the abscess to reduce pain and to continue draining the area. HOME CARE INSTRUCTIONS   Only take over-the-counter or prescription  medicines for pain, discomfort, or fever as directed by your caregiver.  If you were prescribed antibiotics, take them as directed. Finish them even if you start to feel better.  If gauze is used, follow your caregiver's directions for changing the gauze.  To avoid spreading the infection:  Keep your draining abscess covered with a bandage.  Wash your hands well.  Do not share personal care items, towels, or whirlpools with others.  Avoid skin contact with others.  Keep your skin and clothes clean around the abscess.  Keep all follow-up appointments as directed by your caregiver. SEEK MEDICAL CARE IF:   You have increased pain, swelling, redness, fluid drainage, or bleeding.  You have muscle aches, chills, or a general ill feeling.  You have a fever. MAKE SURE YOU:   Understand these instructions.  Will watch your condition.  Will get help right away if you are not doing well or get worse. Document Released: 12/28/2004 Document Revised: 09/19/2011 Document Reviewed: 06/02/2011 [facility] Patient Information 2015 [place], [place]. This information is not intended to replace advice given to you by your health care provider. Make sure you discuss any questions you have with your health care provider.    Dressing Change A dressing is a material placed over wounds. It keeps the wound clean, dry, and protected from further injury. This provides an environment that favors wound healing.  BEFORE YOU BEGIN  Get your supplies together. Things you may need include:  Saline solution.  Flexible gauze dressing.  Medicated cream.  Tape.  Gloves.  Abdominal dressing pads.  Gauze squares.  Plastic bags.  Take pain medicine 30 minutes before the dressing change if you need it.  Take a shower before you do the first dressing change of the day. Use plastic wrap or a plastic bag to prevent the dressing from getting wet. REMOVING YOUR OLD DRESSING   Wash your hands with soap  and water. Dry your hands with a clean towel.  Put on your gloves.  Remove any tape.  Carefully remove the old dressing. If the dressing sticks, you may dampen it with warm water to loosen it, or follow your caregiver's specific directions.  Remove any gauze or packing tape that is in your wound.  Take off your gloves.  Put the gloves, tape, gauze, or any packing tape into a plastic bag. CHANGING YOUR DRESSING  Open the supplies.  Take the cap off the saline solution.  Open the gauze package so that the gauze remains on the inside of the package.  Put on your gloves.  Clean your wound as told by your caregiver.  If you have been told to keep your wound dry, follow those instructions.  Your caregiver may tell you to do one or more of the following:  Pick up the gauze. Pour the saline solution over the gauze. Squeeze out the extra saline solution.  Put medicated cream or other medicine on your wound if you have been told to do so.  Put the solution soaked gauze only in your wound, not on the skin around it.  Pack your wound loosely or as told by your caregiver.  Put dry gauze on your wound.  Put abdominal dressing pads over the dry gauze if your wet gauze soaks through.  Tape the abdominal dressing pads in place so they will not fall off. Do not wrap the tape completely around the affected part (arm, leg, abdomen).  Wrap the dressing pads with a flexible gauze dressing to secure it in place.  Take off your gloves. Put them in the plastic bag with the old dressing. Tie the bag shut and throw it away.  Keep the dressing clean and dry until your next dressing change.  Wash your hands. SEEK MEDICAL CARE IF:  Your skin around the wound looks red.  Your wound feels more tender or sore.  You see pus in the wound.  Your wound smells bad.  You have a fever.  Your skin around the wound has a rash that itches and burns.  You see black or yellow skin in your wound  that was not there before.  You feel nauseous, throw up, and feel very tired. Document Released: 04/27/2004 Document Revised: 06/12/2011 Document Reviewed: 01/30/2011 [facility] Patient Information 2015 [place], [place]. This information is not intended to replace advice given to you by your health care provider. Make sure you discuss any questions you have with your health care provider.   Wound Infection A wound infection happens when a type of germ (bacteria) starts growing in the wound. In some cases, this can cause the wound to break open. If cared for properly, the infected wound will heal from the inside to the outside. Wound infections need treatment. CAUSES An infection is caused by bacteria growing in the wound.  SYMPTOMS   Increase in redness, swelling, or pain at the wound site.  Increase in drainage at the wound site.  Wound or bandage (dressing) starts to smell bad.  Fever.  Feeling tired or fatigued.  Pus draining from the wound. TREATMENT  Your health care provider will prescribe antibiotic medicine. The wound infection should improve within 24 to 48 hours. Any redness around the wound should stop spreading and the wound should be less painful.  HOME CARE INSTRUCTIONS   Only take over-the-counter or prescription medicines for pain, discomfort, or fever as directed by your health care provider.  Take your antibiotics as directed. Finish them even if you start to feel better.  Gently wash the area with mild soap and water 2 times a day, or as directed. Rinse off the soap. Pat the area dry with a clean towel. Do not rub the wound. This may cause bleeding.  Follow your health care provider's instructions for how often you need to change the dressing.  Apply ointment and a dressing to the wound as directed.  If the dressing sticks, moisten it with soapy water and gently remove it.  Change the bandage right away if it becomes wet, dirty, or develops a bad smell.  Take  showers. Do not take tub baths, swim, or do anything that may soak the wound until it is healed.  Avoid exercises that make you sweat heavily.  Use anti-itch medicine as directed by your health care provider. The wound may itch when it is healing. Do not pick or scratch at the wound.  Follow up with your health care provider to get your wound rechecked as directed. SEEK MEDICAL CARE IF:  You have an increase in swelling, pain, or redness around the wound.  You have an increase in the amount of pus coming from the wound.  There is a bad smell coming from the wound.  More of the wound breaks open.  You have a fever. MAKE SURE YOU:   Understand these instructions.  Will watch your condition.  Will get help right away if you are not doing well or get worse. Document Released: 12/17/2002 Document Revised: 03/25/2013 Document Reviewed: 07/24/2010 [facility] Patient Information 2015 [place], [place]. This information is not intended to replace advice given to you by your health care provider. Make sure you discuss any questions you have with your health care provider.

## 2014-03-25 NOTE — ED Notes (Signed)
[facility] [place] spoke with rep Cala BradfordKimberly.

## 2014-03-25 NOTE — ED Notes (Signed)
Attempt to clean pt wound with warm cloth. Pt keeps pulling away. Warm compress applied to wound.

## 2014-03-25 NOTE — ED Notes (Signed)
Attempt to clean wound with saline. Pt keeps pulling away. Dr Criss AlvineGoldston was able to remove gauze. Sutures remain in site. Dr Criss AlvineGoldston will talk with family.

## 2014-07-13 ENCOUNTER — Encounter: Payer: Self-pay | Admitting: Podiatry

## 2014-07-13 ENCOUNTER — Ambulatory Visit (INDEPENDENT_AMBULATORY_CARE_PROVIDER_SITE_OTHER): Payer: Self-pay | Admitting: Podiatry

## 2014-07-13 DIAGNOSIS — M79606 Pain in leg, unspecified: Secondary | ICD-10-CM | POA: Insufficient documentation

## 2014-07-13 DIAGNOSIS — B351 Tinea unguium: Secondary | ICD-10-CM | POA: Insufficient documentation

## 2014-07-13 NOTE — Patient Instructions (Signed)
Seen for hypertrophic nails. All nails debrided. Return in 3 months or as needed.  

## 2014-07-13 NOTE — Progress Notes (Signed)
Subjective: 81 year old female presents via wheel chair accompanied by her care taker for podiatric care.  Objective: Neurovascular status are within normal. All nails are grossly hypertrophic with deformity on both great toes. Mild bunion bilateral.  Assessment: Onychomycosis x 10. Deformity on both hallucal nails.   Plan: All nails debrided.  Patient poorly tolerated.

## 2015-01-02 DEATH — deceased

## 2016-07-24 IMAGING — CT CT HEAD W/O CM
3 of 11 series · 15 of 47 positions shown, 18 images · non-contrast
Comparison: CT head/cervical spine dated 02/24/2014

CLINICAL DATA: Fall, knot on right forehead, confusion

EXAM:
CT HEAD WITHOUT CONTRAST
CT MAXILLOFACIAL WITHOUT CONTRAST
TECHNIQUE: Multidetector CT imaging of the head and maxillofacial structures
were performed using the standard protocol without intravenous
contrast. Multiplanar CT image reconstructions of the maxillofacial
structures were also generated.

[Series 14: maxillofacial 2.0 h30s st · axial · 0.32mm/px · z∈[-211,-49]mm · 10 of 96 slices shown, 13 images]
[im 8/96  brain]
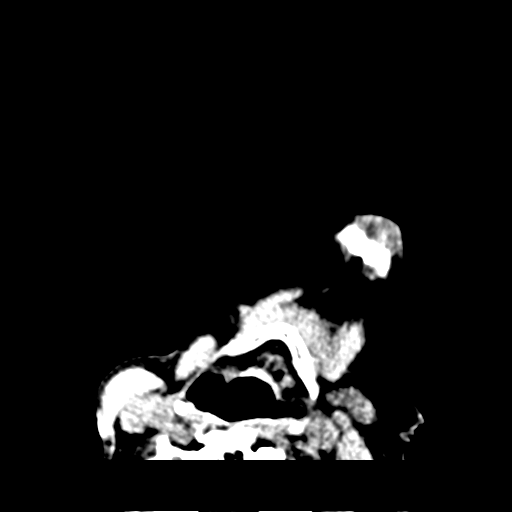
[im 8/96  bone]
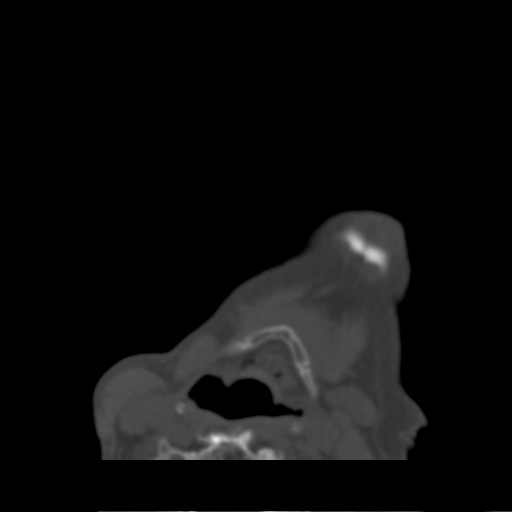
[im 16/96  brain]
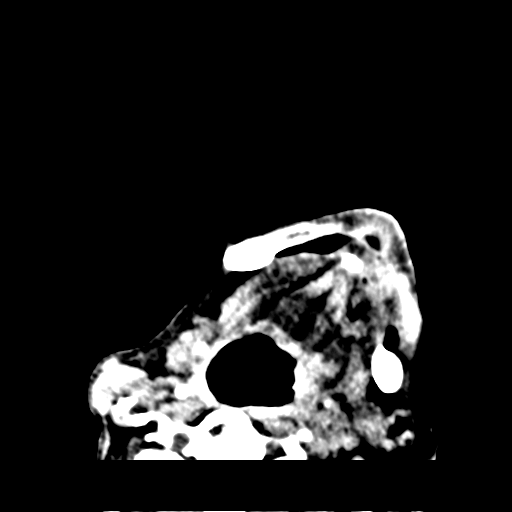
[im 24/96  brain]
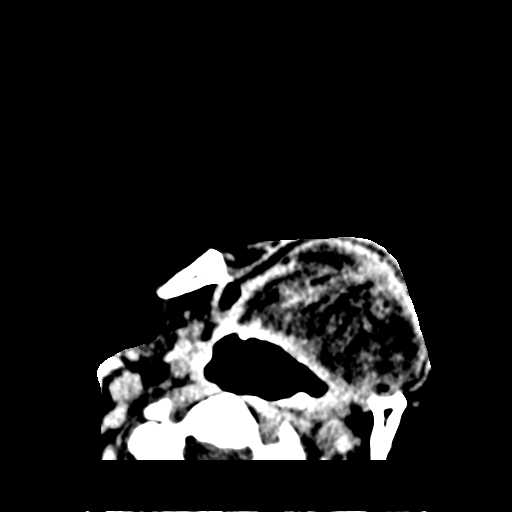
[im 32/96  brain]
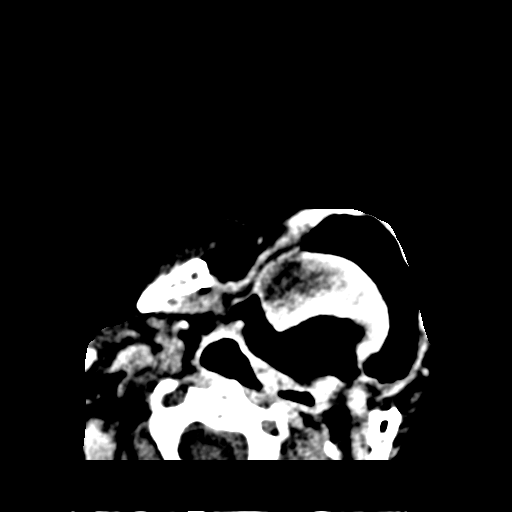
[im 40/96  brain]
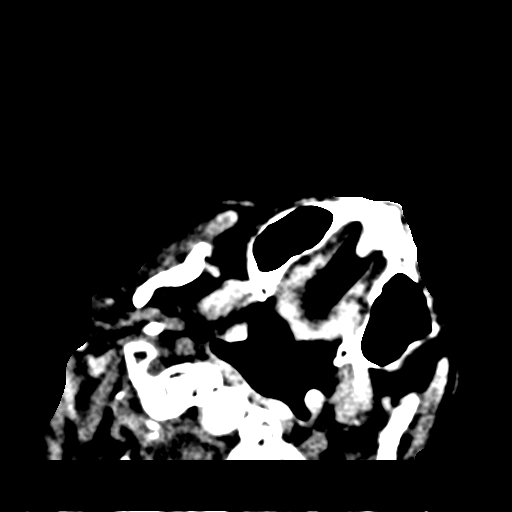
[im 40/96  bone]
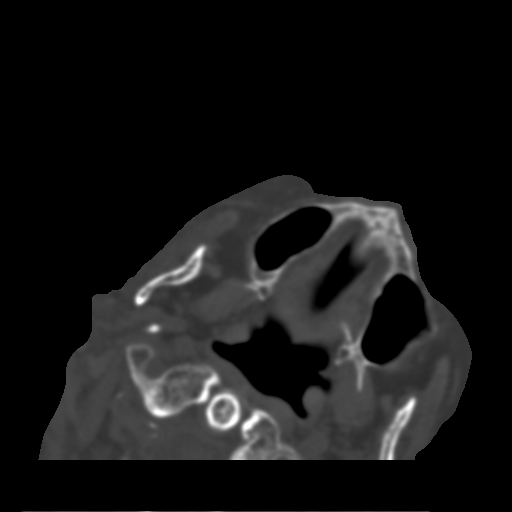
[im 56/96  brain]
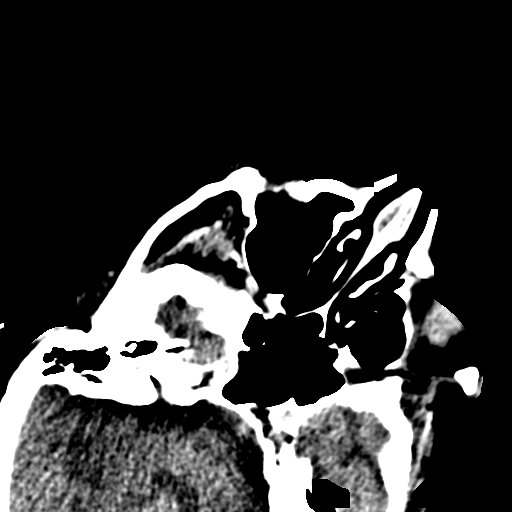
[im 64/96  brain]
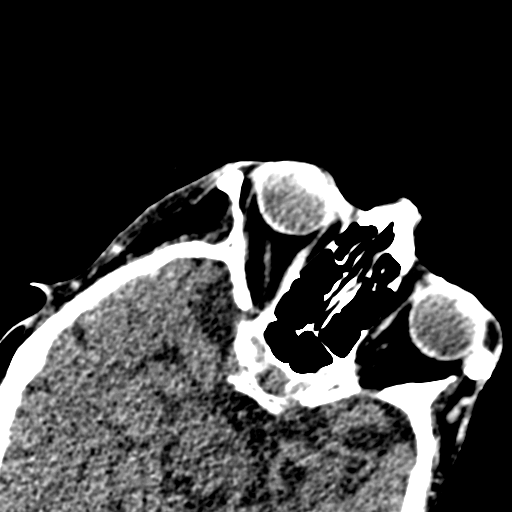
[im 72/96  brain]
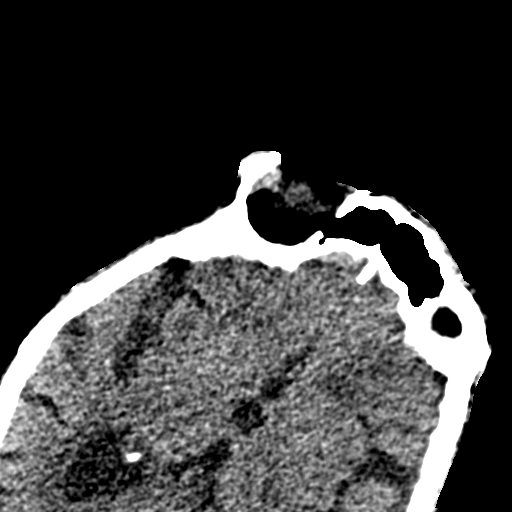
[im 80/96  brain]
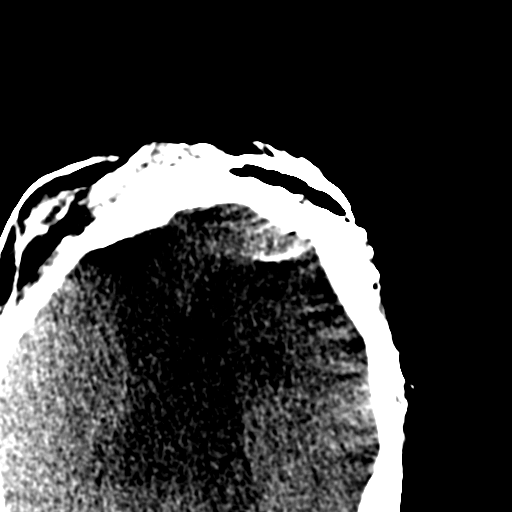
[im 80/96  bone]
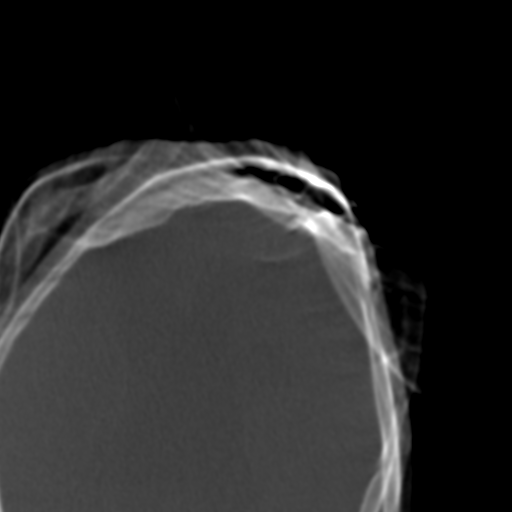
[im 88/96  brain]
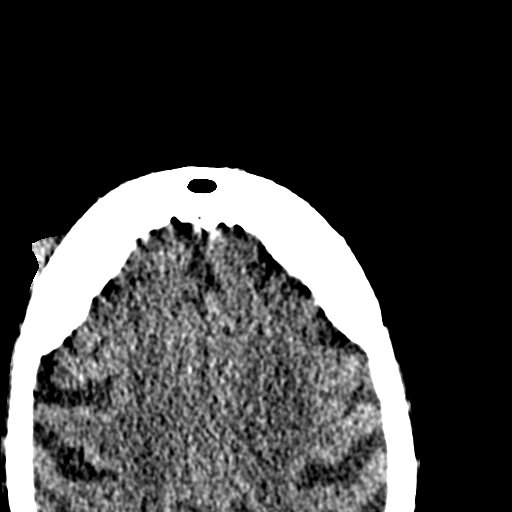

[Series 21: maxillofacial 2.0 coronal · coronal · 0.30mm/px · 3 of 68 slices shown]
[im 17/68  brain]
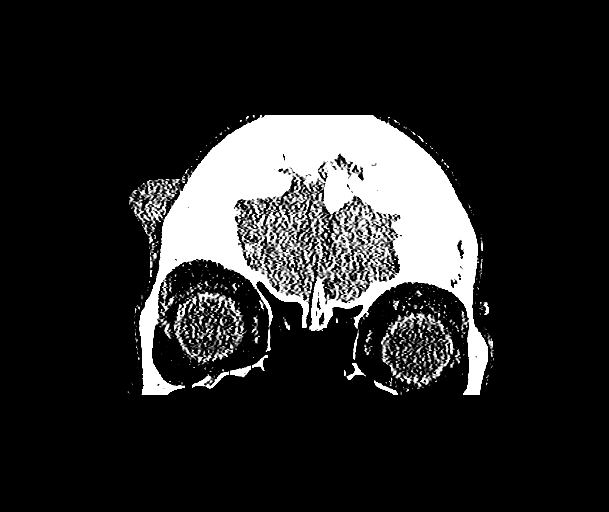
[im 34/68  brain]
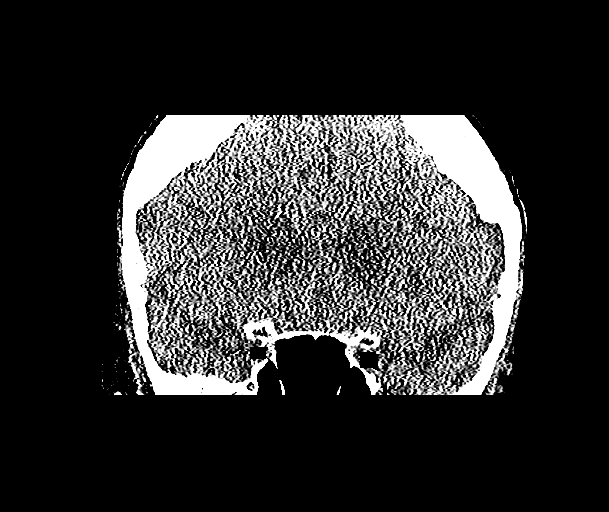
[im 51/68  brain]
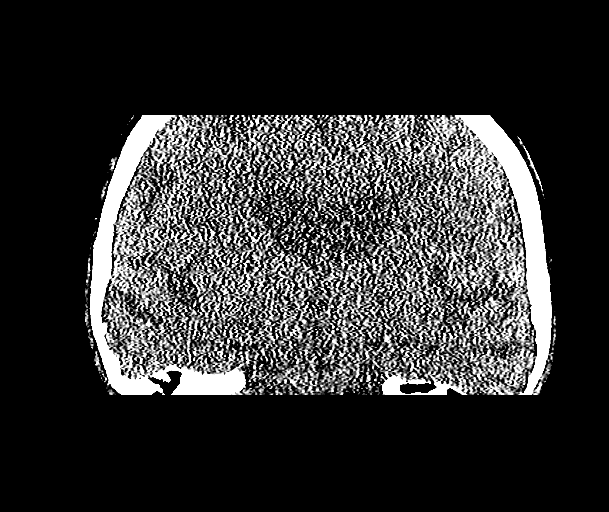

[Series 24: maxillofacial 2.0 sagittal · sagittal · 0.24mm/px · 2 of 71 slices shown]
[im 24/71  brain]
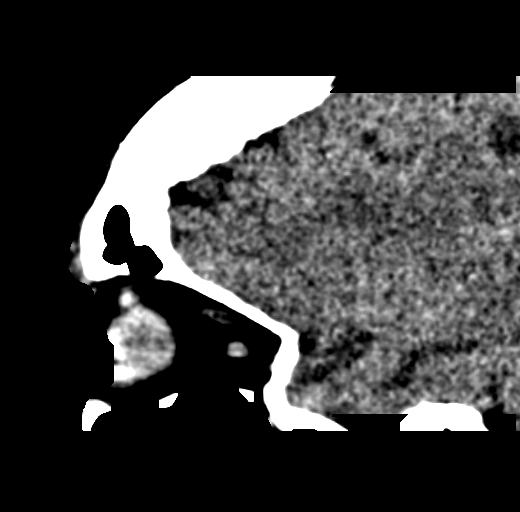
[im 47/71  brain]
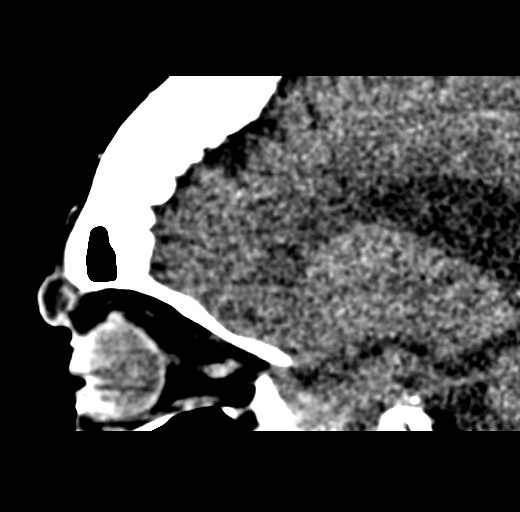

[15 of 47 positions shown; findings below may reference images not displayed]

FINDINGS: CT HEAD FINDINGS

Motion degraded images.

No evidence of parenchymal hemorrhage or extra-axial fluid
collection. No mass lesion, mass effect, or midline shift.

No CT evidence of acute infarction. Old lacunar infarct in the pons
(series 4/image 1).

Extensive small vessel ischemic changes. Intracranial
atherosclerosis.

Global cortical atrophy.  No ventriculomegaly.

The visualized paranasal sinuses are essentially clear. The mastoid
air cells are unopacified.

1.3 x 2.1 cm extracranial hematoma overlying the right frontal bone
(series 4/image 6).

No evidence of calvarial fracture.

CT MAXILLOFACIAL FINDINGS

Motion degraded images.

No evidence of maxillofacial fracture.

The bilateral orbits, including the globes and retroconal soft
tissues, are within normal limits.

Mandible is intact. Bilateral mandibular condyles are well-seated in
the TMJs.

The visualized paranasal sinuses are essentially clear. The mastoid
air cells are unopacified.

Visualized cervical spine is intact to C5-6, noting mild to moderate
degenerative changes.
IMPRESSION: Motion degraded images.

1.3 x 2.1 cm extracranial hematoma overlying the right frontal bone.
No evidence of calvarial fracture. No evidence of acute intracranial
abnormality.

No evidence of maxillofacial fracture.
